# Patient Record
Sex: Male | Born: 1990 | Race: White | Hispanic: No | Marital: Single | Smoking: Never smoker
Health system: Southern US, Community
[De-identification: ages and names within clinical notes are randomized; demographics above are authoritative.]

## PROBLEM LIST (undated history)

## (undated) DIAGNOSIS — F329 Major depressive disorder, single episode, unspecified: Secondary | ICD-10-CM

## (undated) DIAGNOSIS — S2249XA Multiple fractures of ribs, unspecified side, initial encounter for closed fracture: Secondary | ICD-10-CM

## (undated) DIAGNOSIS — R569 Unspecified convulsions: Secondary | ICD-10-CM

## (undated) DIAGNOSIS — K219 Gastro-esophageal reflux disease without esophagitis: Secondary | ICD-10-CM

## (undated) DIAGNOSIS — F32A Depression, unspecified: Secondary | ICD-10-CM

## (undated) DIAGNOSIS — F419 Anxiety disorder, unspecified: Secondary | ICD-10-CM

## (undated) DIAGNOSIS — S2239XA Fracture of one rib, unspecified side, initial encounter for closed fracture: Secondary | ICD-10-CM

## (undated) HISTORY — DX: Multiple fractures of ribs, unspecified side, initial encounter for closed fracture: S22.49XA

## (undated) HISTORY — DX: Fracture of one rib, unspecified side, initial encounter for closed fracture: S22.39XA

---
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
## 2012-10-16 ENCOUNTER — Ambulatory Visit: Payer: Self-pay | Admitting: Medical

## 2013-06-28 HISTORY — PX: OTHER SURGICAL HISTORY: SHX169

## 2014-04-27 ENCOUNTER — Emergency Department: Payer: Self-pay | Admitting: Emergency Medicine

## 2014-04-27 LAB — COMPREHENSIVE METABOLIC PANEL
ALBUMIN: 4.7 g/dL (ref 3.4–5.0)
ALT: 33 U/L
ANION GAP: 7 (ref 7–16)
AST: 27 U/L (ref 15–37)
Alkaline Phosphatase: 93 U/L
BILIRUBIN TOTAL: 1 mg/dL (ref 0.2–1.0)
BUN: 20 mg/dL — ABNORMAL HIGH (ref 7–18)
CREATININE: 1.31 mg/dL — AB (ref 0.60–1.30)
Calcium, Total: 9.1 mg/dL (ref 8.5–10.1)
Chloride: 103 mmol/L (ref 98–107)
Co2: 31 mmol/L (ref 21–32)
EGFR (Non-African Amer.): >60
Glucose: 80 mg/dL (ref 65–99)
Osmolality: 283 (ref 275–301)
Potassium: 3.9 mmol/L (ref 3.5–5.1)
SODIUM: 141 mmol/L (ref 136–145)
Total Protein: 8.2 g/dL (ref 6.4–8.2)

## 2014-04-27 LAB — DRUG SCREEN, URINE
AMPHETAMINES, UR SCREEN: NEGATIVE (ref ?–1000)
BARBITURATES, UR SCREEN: NEGATIVE (ref ?–200)
Benzodiazepine, Ur Scrn: NEGATIVE (ref ?–200)
CANNABINOID 50 NG, UR ~~LOC~~: POSITIVE (ref ?–50)
Cocaine Metabolite,Ur ~~LOC~~: NEGATIVE (ref ?–300)
MDMA (Ecstasy)Ur Screen: NEGATIVE (ref ?–500)
METHADONE, UR SCREEN: NEGATIVE (ref ?–300)
Opiate, Ur Screen: NEGATIVE (ref ?–300)
PHENCYCLIDINE (PCP) UR S: NEGATIVE (ref ?–25)
Tricyclic, Ur Screen: NEGATIVE (ref ?–1000)

## 2014-04-27 LAB — CBC
HCT: 50.2 % (ref 40.0–52.0)
HGB: 16.1 g/dL (ref 13.0–18.0)
MCH: 29.7 pg (ref 26.0–34.0)
MCHC: 32 g/dL (ref 32.0–36.0)
MCV: 93 fL (ref 80–100)
Platelet: 285 10*3/uL (ref 150–440)
RBC: 5.42 10*6/uL (ref 4.40–5.90)
RDW: 12.5 % (ref 11.5–14.5)
WBC: 14.7 10*3/uL — ABNORMAL HIGH (ref 3.8–10.6)

## 2014-04-27 LAB — ACETAMINOPHEN LEVEL: Acetaminophen: <2 ug/mL

## 2014-04-27 LAB — TSH: Thyroid Stimulating Horm: 1.9 u[IU]/mL

## 2014-04-27 LAB — ETHANOL

## 2014-04-27 LAB — SALICYLATE LEVEL

## 2014-04-28 LAB — TROPONIN I

## 2014-06-16 IMAGING — US US PELVIS LIMITED
1 series · 13 of 25 positions shown · non-contrast
Comparison: none

REASON FOR EXAM: CALL REPORT [id number] Lt Testicular Mass
COMMENTS:

PROCEDURE:     US  - US TESTICULAR  - October 16, 2012 [DATE]
RESULT:     Testicular ultrasound dated 10/16/2012.
TECHNIQUE: Grayscale, color flow duplex Doppler, and spectral waveform
imaging was performed of the scrotum. Representative static imaging video
clip active series were provided for interpretation.

[Series 1: us pelvis limited · 0.08mm/px · 28 acquisitions, 13 frames shown]
[im 1/28]
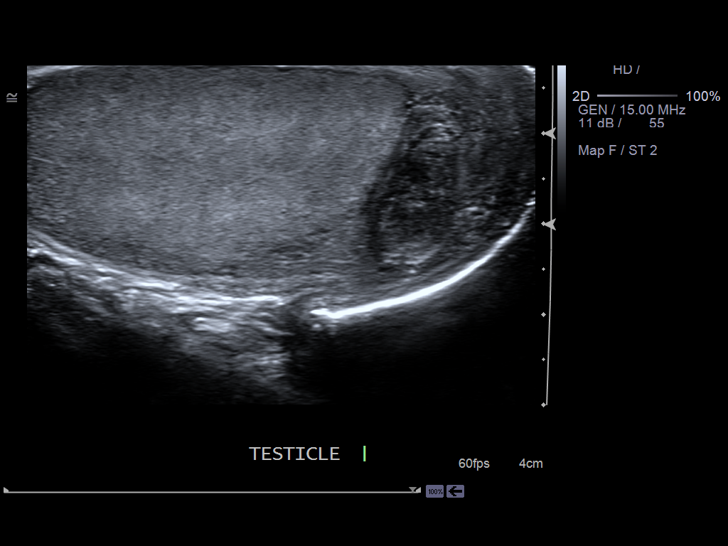
[im 3/28]
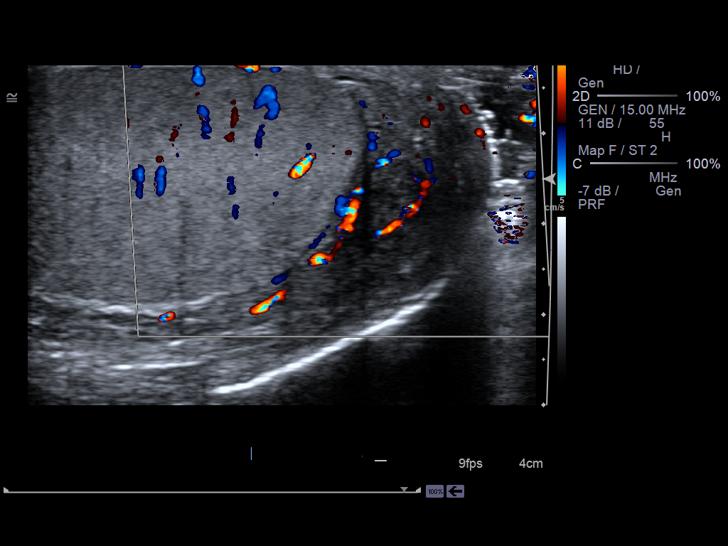
[im 5/28]
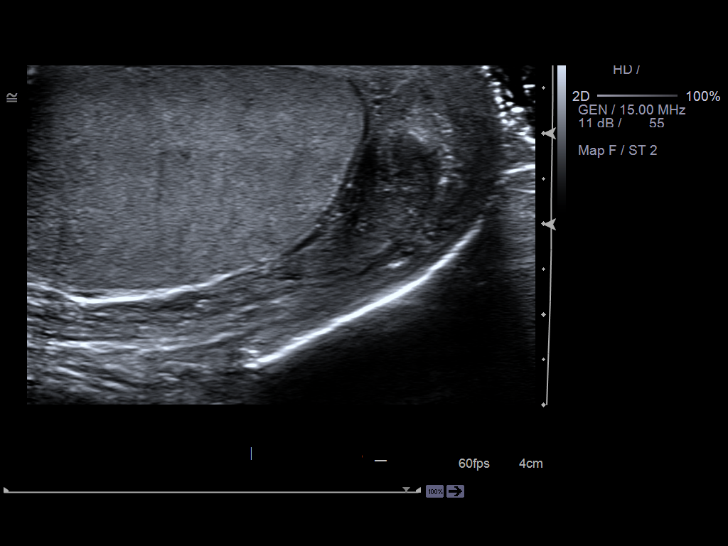
[im 7/28]
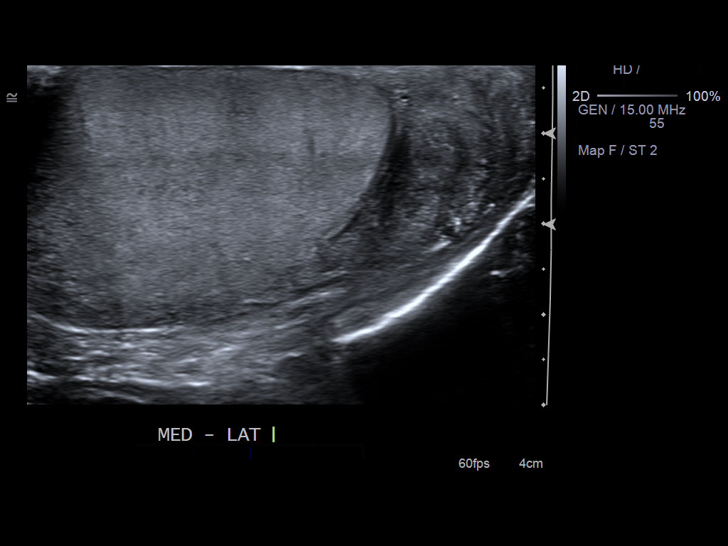
[im 10/28]
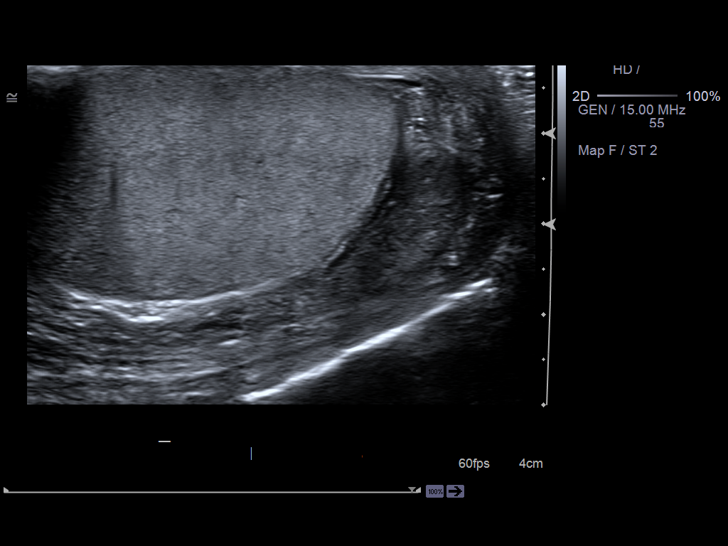
[im 12/28]
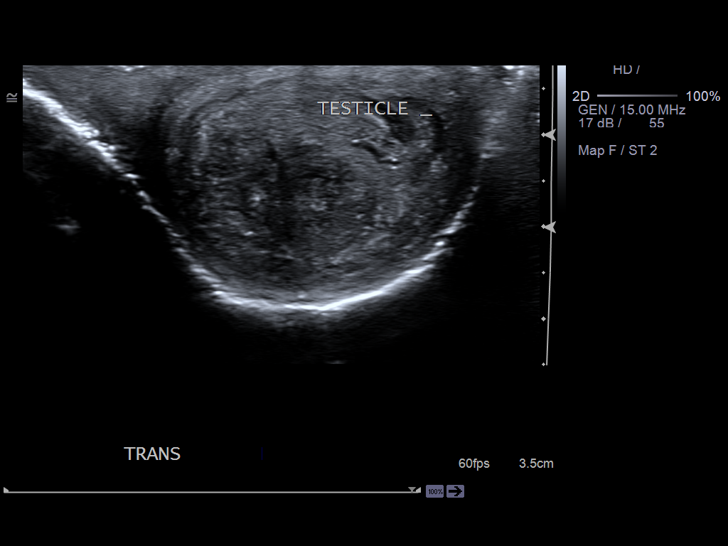
[im 14/28]
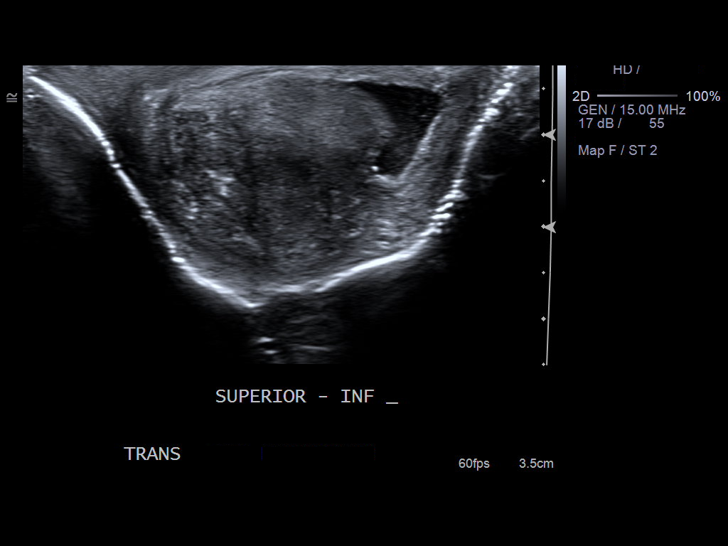
[im 16/28]
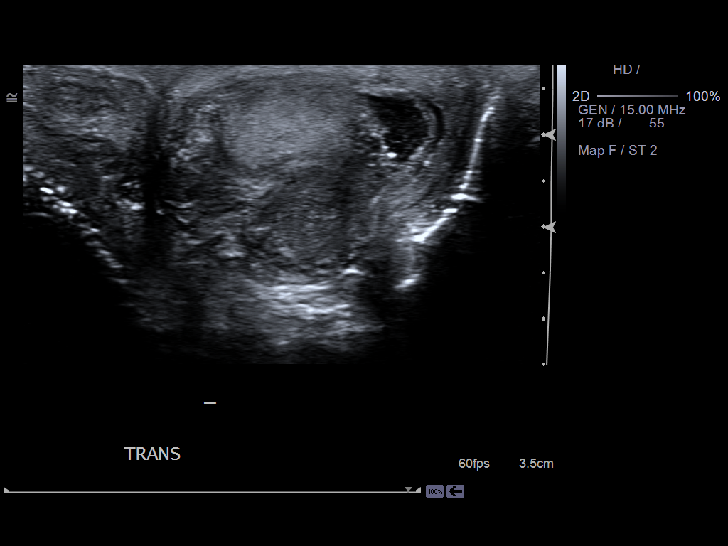
[im 19/28]
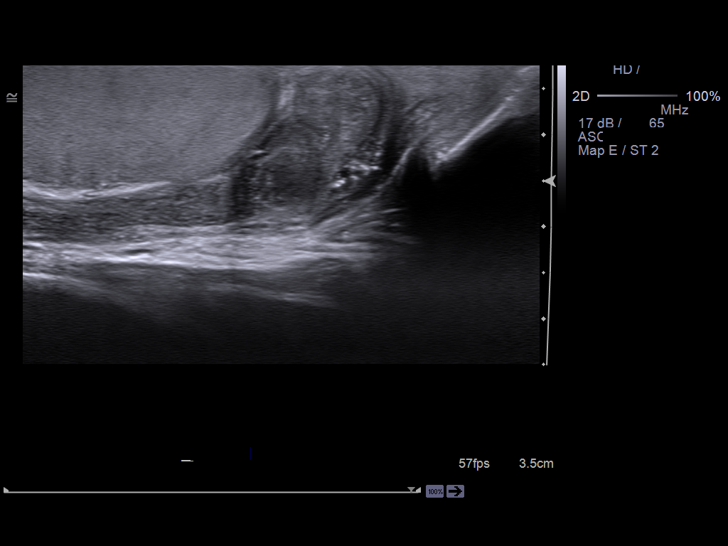
[im 21/28]
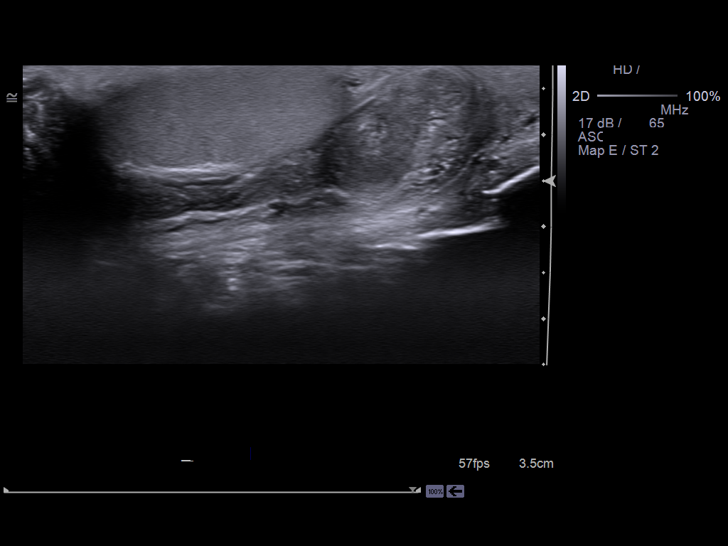
[im 23/28]
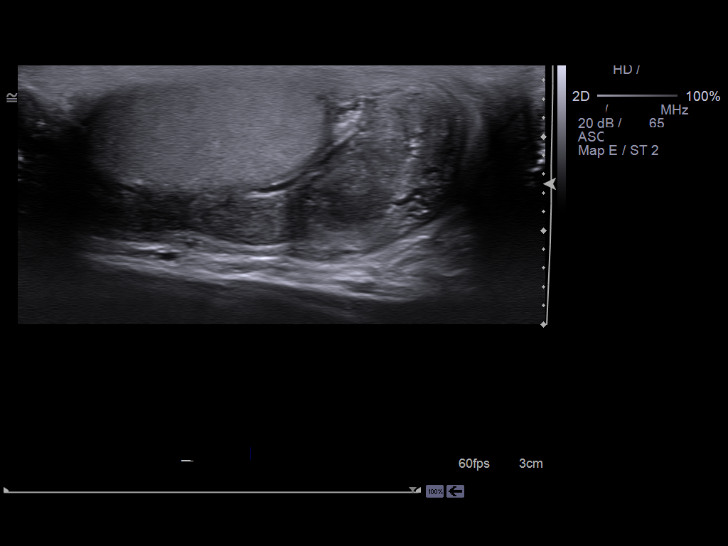
[im 25/28]
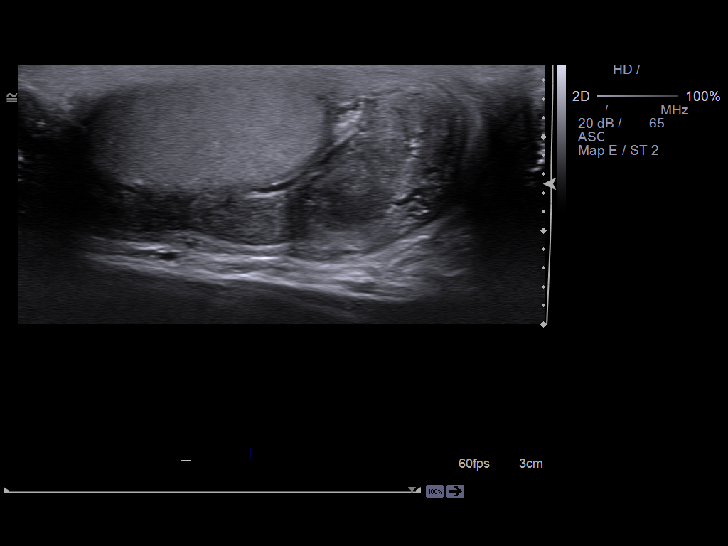
[im 28/28]
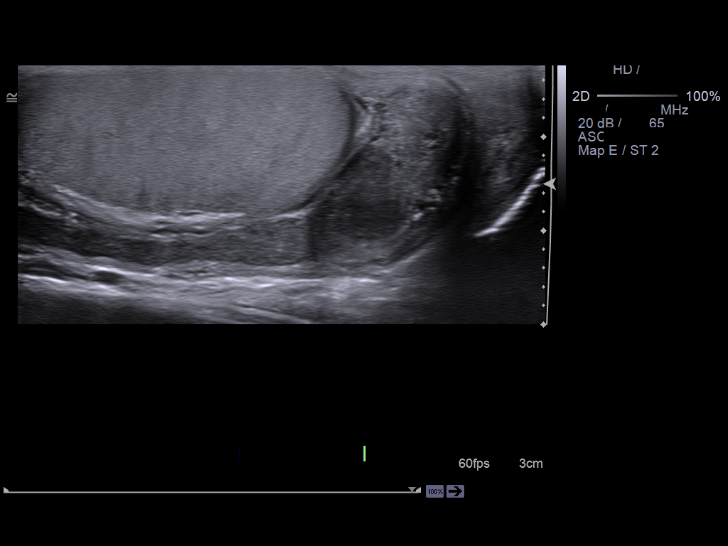

[13 of 25 positions shown; findings below may reference images not displayed]

FINDINGS: The right testicle measures 4.57 x 2.3 x 2.91 cm and the left
x 2.35 x 2.94 cm. The testes demonstrate homogeneous echotexture and
symmetric vascularity. There is no evidence of a hydrocele nor varicocele on
the right and left.

The right epididymis measures 0.72 x 1.2 x 0.74 cm and contains a cyst
measuring 0.72 x 1.2 x 0.74 cm. Appropriate vascularity is appreciated
within the epididymis which is otherwise unremarkable.

Dimensions of left epididymis are difficult to distinguish. A large
heterogeneous hypervascular masslike area projects along the mid to inferior
portion of epididymis measuring 3.94 x  2.49 x 1.56 cm. This finding is
concerning for an area of epididymitis if clinically appropriate. More
ominous etiologies such as mass cannot be excluded particularly there are no
clinical signs and symptoms or history of infection. Urologic consultation
recommended if and as clinically warranted.
IMPRESSION: 1. Findings if clinically appropriate consistent with an area of
epididymitis within the left epididymis. As stated above if there are no
clinical signs and symptoms of infection more ominous etiologies cannot be
excluded and urologic consultation recommended as clinically warranted.

## 2014-06-28 DIAGNOSIS — R569 Unspecified convulsions: Secondary | ICD-10-CM

## 2014-06-28 HISTORY — DX: Unspecified convulsions: R56.9

## 2014-08-28 ENCOUNTER — Emergency Department: Payer: Self-pay | Admitting: Emergency Medicine

## 2015-04-08 ENCOUNTER — Ambulatory Visit (INDEPENDENT_AMBULATORY_CARE_PROVIDER_SITE_OTHER): Payer: BLUE CROSS/BLUE SHIELD | Admitting: Sports Medicine

## 2015-04-08 ENCOUNTER — Encounter: Payer: Self-pay | Admitting: Sports Medicine

## 2015-04-08 VITALS — BP 110/77 | Ht 68.0 in | Wt 165.0 lb

## 2015-04-08 DIAGNOSIS — S060X0A Concussion without loss of consciousness, initial encounter: Secondary | ICD-10-CM

## 2015-04-08 NOTE — Progress Notes (Signed)
   Subjective:    Patient ID: Jerry Chandler, male    DOB: Nov 18, 1990, 24 y.o.   MRN: [id number]  HPI  24 y/o male, rugby player and senior at [employer], who presents following a concussion 10 days ago.  He has had 5-6 concussions in the past, with his last one being 3 years ago.  He was trying to tackle someone playing rubgy and hit his head on their knees.  He had a mild headache for 1 day and nausea for 1 minute after the injury, but no other symptoms.  No LOC.  He has been asymptomatic for 9 days.  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  He has had IMPACT testing, which is similar to baseline.  He claims that he has poor balance at baseline.      Review of Systems  Constitutional: Negative for diaphoresis and fatigue.  Eyes: Negative for visual disturbance.  Neurological: Negative for dizziness, syncope, weakness, light-headedness, numbness and headaches.  Psychiatric/Behavioral: Negative for behavioral problems and agitation.       Objective:   Physical Exam  Constitutional: He is oriented to person, place, and time. He appears well-developed and well-nourished.  Eyes: EOM are normal. Pupils are equal, round, and reactive to light.  Neurological: He is alert and oriented to person, place, and time.  Skin: Skin is warm and dry.  Psychiatric: He has a normal mood and affect. His behavior is normal. Judgment and thought content normal.  Convergence and divergence without nystagmus and with smooth movement.    Bess testing- double stance without errors, tandem stance with 1 error, single leg stance with 3 errors.          Assessment & Plan:  Concussion- resolved, may resume contact sports.  Reviewed with patient to avoid concussions.  He will return to full practice tomorrow.  Will send note to ATC.

## 2015-05-05 ENCOUNTER — Emergency Department
Admission: EM | Admit: 2015-05-05 | Discharge: 2015-05-05 | Disposition: A | Discharge status: Home / Self Care | Payer: BLUE CROSS/BLUE SHIELD | Attending: Emergency Medicine | Admitting: Emergency Medicine

## 2015-05-05 ENCOUNTER — Emergency Department: Payer: BLUE CROSS/BLUE SHIELD

## 2015-05-05 ENCOUNTER — Encounter: Payer: Self-pay | Admitting: Emergency Medicine

## 2015-05-05 DIAGNOSIS — R1013 Epigastric pain: Secondary | ICD-10-CM | POA: Diagnosis present

## 2015-05-05 DIAGNOSIS — Z79899 Other long term (current) drug therapy: Secondary | ICD-10-CM | POA: Insufficient documentation

## 2015-05-05 DIAGNOSIS — R1011 Right upper quadrant pain: Secondary | ICD-10-CM

## 2015-05-05 DIAGNOSIS — K8020 Calculus of gallbladder without cholecystitis without obstruction: Secondary | ICD-10-CM | POA: Insufficient documentation

## 2015-05-05 LAB — CBC
HCT: 49.2 % (ref 40.0–52.0)
Hemoglobin: 16.8 g/dL (ref 13.0–18.0)
MCH: 31.7 pg (ref 26.0–34.0)
MCHC: 34.2 g/dL (ref 32.0–36.0)
MCV: 92.8 fL (ref 80.0–100.0)
PLATELETS: 220 10*3/uL (ref 150–440)
RBC: 5.31 MIL/uL (ref 4.40–5.90)
RDW: 12.3 % (ref 11.5–14.5)
WBC: 8 10*3/uL (ref 3.8–10.6)

## 2015-05-05 LAB — BASIC METABOLIC PANEL
Anion gap: 7 (ref 5–15)
BUN: 11 mg/dL (ref 6–20)
CALCIUM: 9.6 mg/dL (ref 8.9–10.3)
CO2: 31 mmol/L (ref 22–32)
CREATININE: 0.98 mg/dL (ref 0.61–1.24)
Chloride: 101 mmol/L (ref 101–111)
GFR calc non Af Amer: >60 mL/min (ref >60)
Glucose, Bld: 81 mg/dL (ref 65–99)
Potassium: 3.5 mmol/L (ref 3.5–5.1)
SODIUM: 139 mmol/L (ref 135–145)

## 2015-05-05 LAB — TROPONIN I

## 2015-05-05 MED ORDER — OXYCODONE-ACETAMINOPHEN 5-325 MG PO TABS: 1.0000 | ORAL_TABLET | ORAL | Status: DC | PRN

## 2015-05-05 MED ORDER — ONDANSETRON 4 MG PO TBDP: 4.0000 mg | ORAL_TABLET | Freq: Three times a day (TID) | ORAL | Status: AC | PRN

## 2015-05-05 NOTE — Discharge Instructions (Signed)
Please call the general surgery office tomorrow to make an appointment for follow-up. Please eat mild and nongreasy foods to prevent pain.  You may take Tylenol or Motrin for mild to moderate pain, and Percocet for severe pain. Do not drive within 8 hours of taking Percocet. You may take Zofran for nausea.  Please return to the emergency department if you develop worsening pain, fever, inability to keep down fluids, or any other symptoms concerning to you.  Biliary Colic Biliary colic is a pain in the upper abdomen. The pain:  Is usually felt on the right side of the abdomen, but it may also be felt in the center of the abdomen, just below the breastbone (sternum).  May spread back toward the right shoulder blade.  May be steady or irregular.  May be accompanied by nausea and vomiting. Most of the time, the pain goes away in 1-5 hours. After the most intense pain passes, the abdomen may continue to ache mildly for about 24 hours. Biliary colic is caused by a blockage in the bile duct. The bile duct is a pathway that carries bile--a liquid that helps to digest fats--from the gallbladder to the small intestine. Biliary colic usually occurs after eating, when the digestive system demands bile. The pain develops when muscle cells contract forcefully to try to move the blockage so that bile can get by. HOME CARE INSTRUCTIONS  Take medicines only as directed by your health care provider.  Drink enough fluid to keep your urine clear or pale yellow.  Avoid fatty, greasy, and fried foods. These kinds of foods increase your body's demand for bile.  Avoid any foods that make your pain worse.  Avoid overeating.  Avoid having a large meal after fasting. SEEK MEDICAL CARE IF:  You develop a fever.  Your pain gets worse.  You vomit.  You develop nausea that prevents you from eating and drinking. SEEK IMMEDIATE MEDICAL CARE IF:  You suddenly develop a fever and shaking chills.  You  develop a yellowish discoloration (jaundice) of:  Skin.  Whites of the eyes.  Mucous membranes.  You have continuous or severe pain that is not relieved with medicines.  You have nausea and vomiting that is not relieved with medicines.  You develop dizziness or you faint.   This information is not intended to replace advice given to you by your health care provider. Make sure you discuss any questions you have with your health care provider.   Document Released: 11/15/2005 Document Revised: 10/29/2014 Document Reviewed: 03/26/2014 Elsevier Interactive Patient Education [employer]2016 Elsevier Inc.

## 2015-05-05 NOTE — ED Notes (Signed)
Pt reports chest pain x3 days, reports nausea with episodes. Pt denies any radiation. Describes the pain as sharp stabbing consistent pain.

## 2015-05-05 NOTE — ED Notes (Signed)
Pt in NAD.  Discharge instructions provided.  Pt voiced understanding.  Prescriptions with patient.  Teaching done regarding prescriptions and follow up visit with MD.  Voiced understanding.

## 2015-05-05 NOTE — ED Notes (Addendum)
Pt states that pain started a week ago.  States that it doesn't start after moving, but does seems to start more at midday and gets progressively worse throughout the day.  Denies nausea/vomiting/shortness of breath/diaphoresis.  Pt in NAD at this time.  States that pain was intermittent when it first started a week ago, but seems to be getting progressively worse.  States that tylenol, ibuprofen, tums, and pepto and none of which helped the pain.  States that he has a prescription for ativan for anxiety and that it seems to ease off the pain some.  States that the pain is not related to any anxiety that he has ever had in the past.

## 2015-05-05 NOTE — ED Provider Notes (Signed)
[facility] Emergency Department Provider Note  ____________________________________________  Time seen: Approximately 9:21 PM  I have reviewed the triage vital signs and the nursing notes.   HISTORY  Chief Complaint Chest Pain    HPI Jerry Chandler is a 24 y.o. male with a history of anxiety presenting w/ 1wk of epigastric abd pain.  Pt reports a burning sensation in the epigastric area.  No assoc n/v/d, fever, chills, urinary sx's, chest pain,palpitations or sob.  No improvement with Advil, TUMS or Pepto Bismol.  Stool is more green than usual. No relation to food. Pain is better in the morning and worse throughout the day but not worse when he lays down. No acidic taste in his mouth or burping.   History reviewed. No pertinent past medical history.  There are no active problems to display for this patient.   History reviewed. No pertinent past surgical history.  Current Outpatient Rx  Name  Route  Sig  Dispense  Refill  . ondansetron (ZOFRAN ODT) 4 MG disintegrating tablet   Oral   Take 1 tablet (4 mg total) by mouth every 8 (eight) hours as needed for nausea or vomiting.   20 tablet   0   . ondansetron (ZOFRAN) 4 MG tablet      TAKE 1 TABLET BY MOUTH 4 TIMES A DAY AS NEEDED FOR NAUSEA & VOMITING.      0   . oxyCODONE-acetaminophen (ROXICET) 5-325 MG tablet   Oral   Take 1 tablet by mouth every 4 (four) hours as needed.   6 tablet   0   . venlafaxine XR (EFFEXOR-XR) 75 MG 24 hr capsule   Oral   Take 75 mg by mouth daily.      6     Allergies Morphine and related  No family history on file.  Social History Social History  Substance Use Topics  . Smoking status: Never Smoker   . Smokeless tobacco: None  . Alcohol Use: No    Review of Systems Constitutional: No fever/chills. No lightheadedness or syncope. Eyes: No visual changes. ENT: No sore throat. Cardiovascular: Denies chest pain, palpitations. Respiratory: Denies  shortness of breath.  No cough. Gastrointestinal: Positive abdominal pain.  No nausea, no vomiting.  No diarrhea.  No constipation. Genitourinary: Negative for dysuria. No penile or testicular pain. Musculoskeletal: Negative for back pain. Skin: Negative for rash. Neurological: Negative for headaches, focal weakness or numbness.  10-point ROS otherwise negative.  ____________________________________________   PHYSICAL EXAM:  VITAL SIGNS: ED Triage Vitals  Enc Vitals Group     BP 05/05/15 1830 145/76 mmHg     Pulse Rate 05/05/15 1830 59     Resp 05/05/15 1830 16     Temp 05/05/15 1830 98.1 F (36.7 C)     Temp Source 05/05/15 1830 Oral     SpO2 05/05/15 1830 100 %     Weight 05/05/15 1828 165 lb (74.844 kg)     Height 05/05/15 1828  (1.727 m)     Head Cir --      Peak Flow --      Pain Score 05/05/15 1828 5     Pain Loc --      Pain Edu? --      Excl. in GC? --     Constitutional: Alert and oriented. Well appearing and in no acute distress. Answer question appropriately. Eyes: Conjunctivae are normal.  EOMI. Head: Atraumatic. Nose: No congestion/rhinnorhea. Mouth/Throat: Mucous membranes are moist.  Neck: No stridor.  Supple.   Cardiovascular: Normal rate, regular rhythm. No murmurs, rubs or gallops.  Respiratory: Normal respiratory effort.  No retractions. Lungs CTAB.  No wheezes, rales or ronchi. Gastrointestinal: Soft and nondistended. Tender to palpation in the right upper quadrant greater than epigastric area. No rebound or guarding, no peritoneal signs. Negative Murphy's sign Musculoskeletal: No LE edema.  Neurologic:  Normal speech and language. No gross focal neurologic deficits are appreciated.  Skin:  Skin is warm, dry and intact. No rash noted. Psychiatric: Mood and affect are normal. Speech and behavior are normal.  Normal judgement.  ____________________________________________   LABS (all labs ordered are listed, but only abnormal results are  displayed)  Labs Reviewed  BASIC METABOLIC PANEL  CBC  TROPONIN I   ____________________________________________  EKG  ED ECG REPORT I, Rockne Menghini, the attending physician, personally viewed and interpreted this ECG.   Date: 05/05/2015  EKG Time: 1831  Rate: 67  Rhythm: normal sinus rhythm  Axis: Normal  Intervals:none  ST&T Change: No ST changes. No ischemic changes.  ____________________________________________  RADIOLOGY  Dg Chest 2 View  05/05/2015  CLINICAL DATA:  Right-sided chest pain off and on for 1 week. EXAM: CHEST  2 VIEW COMPARISON:  None. FINDINGS: The heart size and mediastinal contours are within normal limits. Both lungs are clear. The visualized skeletal structures are unremarkable. IMPRESSION: Normal chest x-ray. Electronically Signed   By: Bary Richard M.D.   On: 05/05/2015 19:09   US Abdomen Limited Ruq  05/05/2015  CLINICAL DATA:  Right upper quadrant abdominal pain for 1 week, worsening today. EXAM: US ABDOMEN LIMITED - RIGHT UPPER QUADRANT COMPARISON:  None. FINDINGS: Gallbladder: Numerous small layering gallstones and echogenic sludge. The largest calculus measures 6 mm. Borderline gallbladder wall thickening and slight irregularity but no pericholecystic fluid. Negative sonographic Murphy sign. Common bile duct: Diameter: 4.4 mm Liver: Normal echogenicity without focal lesion or biliary dilatation. IMPRESSION: Cholelithiasis with mild/early gallbladder wall thickening. Normal caliber common bile duct and normal liver. Electronically Signed   By: Rudie Meyer M.D.   On: 05/05/2015 22:29    ____________________________________________   PROCEDURES  Procedure(s) performed: None  Critical Care performed: No ____________________________________________   INITIAL IMPRESSION / ASSESSMENT AND PLAN / ED COURSE  Pertinent labs & imaging results that were available during my care of the patient were reviewed by me and considered in my medical  decision making (see chart for details).  24 y.o. male, otherwise healthy, presenting with 1 week of intermittent epigastric pain but on exam has right upper quadrant greater than epigastric pain. It is unlikely that he has cholecystitis given the lack of nausea vomiting and fever but we will evaluate his gallbladder for gallbladder disease. If his ultrasound is negative, I will anticipate treating him for GERD with close PMD follow-up.  ----------------------------------------- 10:46 PM on 05/05/2015 -----------------------------------------  The patient has cholelithiasis with mild or early gallbladder wall thickening on his ultrasound. He has afebrile and has normal white blood cell count. His exam is very reassuring with a negative Murphy sign. He is able to tolerate by mouth. I have talked with Dr. Tonita Cong, who recommends close outpatient follow-up. I have discussed the findings with the patient as well as the follow-up plan and he understands. He will return if he has any red flag symptoms. ____________________________________________  FINAL CLINICAL IMPRESSION(S) / ED DIAGNOSES  Final diagnoses:  Right upper quadrant pain  Calculus of gallbladder without cholecystitis without obstruction  NEW MEDICATIONS STARTED DURING THIS VISIT:  New Prescriptions   ONDANSETRON (ZOFRAN ODT) 4 MG DISINTEGRATING TABLET    Take 1 tablet (4 mg total) by mouth every 8 (eight) hours as needed for nausea or vomiting.   OXYCODONE-ACETAMINOPHEN (ROXICET) 5-325 MG TABLET    Take 1 tablet by mouth every 4 (four) hours as needed.     Rockne MenghiniAnne-Caroline Vaanya Shambaugh, MD 05/05/15 2247

## 2015-05-08 ENCOUNTER — Observation Stay
Admission: EM | Admit: 2015-05-08 | Discharge: 2015-05-11 | Disposition: A | Discharge status: Home / Self Care | Payer: BLUE CROSS/BLUE SHIELD | Attending: General Surgery | Admitting: General Surgery

## 2015-05-08 ENCOUNTER — Encounter: Payer: Self-pay | Admitting: Surgery

## 2015-05-08 ENCOUNTER — Telehealth: Payer: Self-pay

## 2015-05-08 ENCOUNTER — Ambulatory Visit (INDEPENDENT_AMBULATORY_CARE_PROVIDER_SITE_OTHER): Payer: BLUE CROSS/BLUE SHIELD | Admitting: Surgery

## 2015-05-08 ENCOUNTER — Encounter: Payer: Self-pay | Admitting: Emergency Medicine

## 2015-05-08 ENCOUNTER — Other Ambulatory Visit
Admission: RE | Admit: 2015-05-08 | Discharge: 2015-05-08 | Disposition: A | Discharge status: Home / Self Care | Payer: BLUE CROSS/BLUE SHIELD | Source: Ambulatory Visit | Attending: Surgery | Admitting: Surgery

## 2015-05-08 VITALS — BP 126/84 | HR 80 | Temp 98.1°F | Ht 68.0 in | Wt 169.6 lb

## 2015-05-08 DIAGNOSIS — Z8349 Family history of other endocrine, nutritional and metabolic diseases: Secondary | ICD-10-CM | POA: Diagnosis not present

## 2015-05-08 DIAGNOSIS — G8929 Other chronic pain: Secondary | ICD-10-CM | POA: Diagnosis not present

## 2015-05-08 DIAGNOSIS — R7989 Other specified abnormal findings of blood chemistry: Secondary | ICD-10-CM | POA: Insufficient documentation

## 2015-05-08 DIAGNOSIS — K8010 Calculus of gallbladder with chronic cholecystitis without obstruction: Principal | ICD-10-CM | POA: Insufficient documentation

## 2015-05-08 DIAGNOSIS — R17 Unspecified jaundice: Secondary | ICD-10-CM | POA: Insufficient documentation

## 2015-05-08 DIAGNOSIS — R1010 Upper abdominal pain, unspecified: Secondary | ICD-10-CM

## 2015-05-08 DIAGNOSIS — K8051 Calculus of bile duct without cholangitis or cholecystitis with obstruction: Secondary | ICD-10-CM | POA: Insufficient documentation

## 2015-05-08 DIAGNOSIS — Z803 Family history of malignant neoplasm of breast: Secondary | ICD-10-CM | POA: Insufficient documentation

## 2015-05-08 DIAGNOSIS — K8050 Calculus of bile duct without cholangitis or cholecystitis without obstruction: Secondary | ICD-10-CM | POA: Diagnosis present

## 2015-05-08 DIAGNOSIS — Z9889 Other specified postprocedural states: Secondary | ICD-10-CM | POA: Insufficient documentation

## 2015-05-08 DIAGNOSIS — R1011 Right upper quadrant pain: Principal | ICD-10-CM

## 2015-05-08 LAB — CBC
HCT: 48.2 % (ref 40.0–52.0)
Hemoglobin: 15.9 g/dL (ref 13.0–18.0)
MCH: 30.7 pg (ref 26.0–34.0)
MCHC: 33 g/dL (ref 32.0–36.0)
MCV: 92.9 fL (ref 80.0–100.0)
PLATELETS: 202 10*3/uL (ref 150–440)
RBC: 5.19 MIL/uL (ref 4.40–5.90)
RDW: 12.4 % (ref 11.5–14.5)
WBC: 7.1 10*3/uL (ref 3.8–10.6)

## 2015-05-08 LAB — COMPREHENSIVE METABOLIC PANEL
ALK PHOS: 230 U/L — AB (ref 38–126)
ALT: 850 U/L — AB (ref 17–63)
ALT: 784 U/L — AB (ref 17–63)
AST: 264 U/L — AB (ref 15–41)
AST: 238 U/L — AB (ref 15–41)
Albumin: 4.6 g/dL (ref 3.5–5.0)
Albumin: 4.1 g/dL (ref 3.5–5.0)
Alkaline Phosphatase: 212 U/L — ABNORMAL HIGH (ref 38–126)
Anion gap: 8 (ref 5–15)
Anion gap: 6 (ref 5–15)
BILIRUBIN TOTAL: 4.7 mg/dL — AB (ref 0.3–1.2)
BUN: 11 mg/dL (ref 6–20)
BUN: 12 mg/dL (ref 6–20)
CALCIUM: 9.6 mg/dL (ref 8.9–10.3)
CALCIUM: 9.6 mg/dL (ref 8.9–10.3)
CHLORIDE: 101 mmol/L (ref 101–111)
CO2: 29 mmol/L (ref 22–32)
CO2: 30 mmol/L (ref 22–32)
CREATININE: 1.08 mg/dL (ref 0.61–1.24)
CREATININE: 1.1 mg/dL (ref 0.61–1.24)
Chloride: 102 mmol/L (ref 101–111)
Glucose, Bld: 94 mg/dL (ref 65–99)
Glucose, Bld: 116 mg/dL — ABNORMAL HIGH (ref 65–99)
Potassium: 4.1 mmol/L (ref 3.5–5.1)
Potassium: 3.6 mmol/L (ref 3.5–5.1)
SODIUM: 138 mmol/L (ref 135–145)
Sodium: 138 mmol/L (ref 135–145)
TOTAL PROTEIN: 7.7 g/dL (ref 6.5–8.1)
Total Bilirubin: 5.6 mg/dL — ABNORMAL HIGH (ref 0.3–1.2)
Total Protein: 7.9 g/dL (ref 6.5–8.1)

## 2015-05-08 LAB — CBC WITH DIFFERENTIAL/PLATELET
BASOS ABS: 0 10*3/uL (ref 0–0.1)
Basophils Relative: 1 %
EOS PCT: 2 %
Eosinophils Absolute: 0.1 10*3/uL (ref 0–0.7)
HCT: 50.7 % (ref 40.0–52.0)
HEMOGLOBIN: 16.7 g/dL (ref 13.0–18.0)
LYMPHS ABS: 1.1 10*3/uL (ref 1.0–3.6)
LYMPHS PCT: 18 %
MCH: 30.9 pg (ref 26.0–34.0)
MCHC: 32.9 g/dL (ref 32.0–36.0)
MCV: 93.9 fL (ref 80.0–100.0)
Monocytes Absolute: 0.3 10*3/uL (ref 0.2–1.0)
Monocytes Relative: 6 %
NEUTROS PCT: 73 %
Neutro Abs: 4.4 10*3/uL (ref 1.4–6.5)
PLATELETS: 221 10*3/uL (ref 150–440)
RBC: 5.4 MIL/uL (ref 4.40–5.90)
RDW: 12.7 % (ref 11.5–14.5)
WBC: 5.9 10*3/uL (ref 3.8–10.6)

## 2015-05-08 LAB — URINALYSIS COMPLETE WITH MICROSCOPIC (ARMC ONLY)
BACTERIA UA: NONE SEEN
GLUCOSE, UA: NEGATIVE mg/dL
Hgb urine dipstick: NEGATIVE
Ketones, ur: NEGATIVE mg/dL
LEUKOCYTES UA: NEGATIVE
NITRITE: NEGATIVE
PH: 6 (ref 5.0–8.0)
Protein, ur: NEGATIVE mg/dL
RBC / HPF: NONE SEEN RBC/hpf (ref 0–5)
SQUAMOUS EPITHELIAL / LPF: NONE SEEN
Specific Gravity, Urine: 1.011 (ref 1.005–1.030)

## 2015-05-08 LAB — LIPASE, BLOOD
Lipase: 27 U/L (ref 11–51)
Lipase: 27 U/L (ref 11–51)

## 2015-05-08 MED ORDER — LACTATED RINGERS IV SOLN
INTRAVENOUS | Status: DC
  Administered 2015-05-08 – 2015-05-11 (×8): via INTRAVENOUS

## 2015-05-08 MED ORDER — ONDANSETRON 4 MG PO TBDP: 4.0000 mg | ORAL_TABLET | Freq: Four times a day (QID) | ORAL | Status: DC | PRN

## 2015-05-08 MED ORDER — DIPHENHYDRAMINE HCL 50 MG/ML IJ SOLN: 25.0000 mg | Freq: Four times a day (QID) | INTRAMUSCULAR | Status: DC | PRN

## 2015-05-08 MED ORDER — HYDROMORPHONE HCL 1 MG/ML IJ SOLN
1.0000 mg | INTRAMUSCULAR | Status: DC | PRN
Start: 2015-05-08 — End: 2015-05-11

## 2015-05-08 MED ORDER — SODIUM CHLORIDE 0.9 % IV SOLN
Freq: Once | INTRAVENOUS | Status: AC
  Administered 2015-05-08: 1000 mL via INTRAVENOUS

## 2015-05-08 MED ORDER — ONDANSETRON HCL 4 MG/2ML IJ SOLN
4.0000 mg | Freq: Four times a day (QID) | INTRAMUSCULAR | Status: DC | PRN
  Administered 2015-05-09 (×2): 4 mg via INTRAVENOUS
  Filled 2015-05-08: qty 2

## 2015-05-08 MED ORDER — DIPHENHYDRAMINE HCL 25 MG PO CAPS
25.0000 mg | ORAL_CAPSULE | Freq: Four times a day (QID) | ORAL | Status: DC | PRN
Start: 2015-05-08 — End: 2015-05-11

## 2015-05-08 NOTE — ED Notes (Signed)
Surgeon at bedside.  

## 2015-05-08 NOTE — ED Notes (Signed)
Px with gallstones X 2 days ago in ER, pt back to ER with complaint or RUQ pain, nausea and vomiting in AM. Pt eyes appear mildly jaundiced. Pt alert and oriented X4, active, cooperative, pt in NAD. RR even and unlabored, color WNL.

## 2015-05-08 NOTE — Telephone Encounter (Signed)
Received labs back on patient at this time. Liver enzymes are extremely elevated. Notified Dr. Excell Seltzerooper of results. He suggests that patient come to emergency room at this time to get admitted with possible ERCP or surgery to follow.  Informed Dr. Juliann PulseLundquist (on call) that patient will be coming in to Emergency Room shortly.  Spoke with patient, informed him of lab results, and that he needs to make his way to emergency room immediately and to remain with nothing by mouth until he arrives. He verbalizes understanding of this information and will get to Emergency Department.

## 2015-05-08 NOTE — H&P (Signed)
Patient ID: Jerry Chandler, male   DOB: [birth year], 24 y.o.   MRN: [id number]  CC: ABDOMINAL PAIN  HPI Jerry Chandler is a 24 y.o. male with a known history of cholelithiasis who was sent to the emergency department from the surgery clinic secondary to new onset jaundice. Patient states that his pain has been pretty stable but constant in the right upper quadrant. He's not changed much since he was seen by the clinic earlier today. Denies any fevers, chills, nausea, vomiting, diarrhea, constipation. He has had some acholic stools denies any currently. 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  Abdominal Pain    Past Medical History  Diagnosis Date  . Rib fractures     secondary to vomiting with Norovirus    Past Surgical History  Procedure Laterality Date  . Facial fracture surgery  2009  . Left foot surgery  2012  . Gum graft  2015    x 2    Family History  Problem Relation Age of Onset  . Hyperlipidemia Father   . Cancer Maternal Aunt     Breast    Social History Social History  Substance Use Topics  . Smoking status: Never Smoker   . Smokeless tobacco: Never Used  . Alcohol Use: 21.6 oz/week    0 Standard drinks or equivalent, 36 Shots of liquor per week     Comment: Every other day    Allergies  Allergen Reactions  . Morphine Rash  . Lactase     No current facility-administered medications for this encounter.     Review of Systems A multipoint review of systems was completed. All pertinent positives negatives within history of present illness remainder negative.  Physical Exam Blood pressure 152/79, pulse 61, temperature 97.6 F (36.4 C), temperature source Oral, resp. rate 18, height  (1.727 m), weight 77.111 kg (170 lb), SpO2 100 %. CONSTITUTIONAL: No acute distress and appropriate.Marland Kitchen EYES: Pupils are equal, round, and reactive to light, Sclera are jaundiced. EARS, NOSE, MOUTH AND THROAT: The  oropharynx is clear. The oral mucosa is pink and moist. Hearing is intact to voice. LYMPH NODES:  Lymph nodes in the neck are normal. RESPIRATORY:  Lungs are clear. There is normal respiratory effort, with equal breath sounds bilaterally, and without pathologic use of accessory muscles. CARDIOVASCULAR: Heart is regular without murmurs, gallops, or rubs. GI: The abdomen is soft, minimally tender in the right upper quadrant, and nondistended. There are no palpable masses. There is no hepatosplenomegaly. There are normal bowel sounds in all quadrants. GU: Rectal deferred.   MUSCULOSKELETAL: Normal muscle strength and tone. No cyanosis or edema.   SKIN: Turgor is good and there are no pathologic skin lesions or ulcers. NEUROLOGIC: Motor and sensation is grossly normal. Cranial nerves are grossly intact. PSYCH:  Oriented to person, place and time. Affect is normal.  Data Reviewed Images and labs reviewed. Ultrasound from earlier this week shows cholelithiasis. Labs today concerning for hyperbilirubinemia otherwise normal labs. I have personally reviewed the patient's imaging, laboratory findings and medical records.    Assessment    24 year old male with choledocholithiasis.    Plan    Discussed with the patient we will trend his bilirubin and should decrease could be indicative having passed a stone. If he passes the stone will proceed with a lap scopic cholecystectomy with intraoperative clinic room. Should his bilirubin remained stable or increase he may require an ERCP prior to cholecystectomy.  Patient voiced understanding. We'll admit for observation. By mouth with IV hydration and when necessary medications.     Time spent with the patient was 30 minutes, with more than 50% of the time spent in face-to-face education, counseling and care coordination.     Ricarda FrameCharles Zachry Hopfensperger 05/08/2015, 7:52 PM

## 2015-05-08 NOTE — ED Provider Notes (Signed)
[facility] Emergency Department Provider Note  ____________________________________________  Time seen: Approximately 5:29 PM  I have reviewed the triage vital signs and the nursing notes.   HISTORY  Chief Complaint Abdominal Pain    HPI Jerry Chandler is a 24 y.o. male with no significant past medical history who presents from the surgery clinic with elevated liver enzymes and bilirubin.  He was diagnosed several days ago with gallstones and possible early cholecystitis, but his pain and nausea/vomiting were well controlled and he followed up as an outpatient today.  Upon repeat lab testing today he was found to have significantly elevated LFTs and a T bili of nearly 6.  He was sent to the emergency department for further evaluation.  The patient reports that the symptoms started about a week ago with essentially constant pain since that time in his right upper quadrant.  Food seems to make it worse.  He describes the symptoms as mild at baseline but they do get severe at times.  He does have intermittent nausea and vomiting.  He denies fever/chills, chest pain, shortness of breath, dysuria.  He does note that his urine is very dark today.He was seen in clinic by Dr. Excell Seltzer today.   Past Medical History  Diagnosis Date  . Rib fractures     secondary to vomiting with Norovirus    There are no active problems to display for this patient.   Past Surgical History  Procedure Laterality Date  . Facial fracture surgery  2009  . Left foot surgery  2012  . Gum graft  2015    x 2    Current Outpatient Rx  Name  Route  Sig  Dispense  Refill  . LORazepam (ATIVAN) 0.5 MG tablet   Oral   Take 0.5 mg by mouth every 8 (eight) hours as needed for anxiety.         . ondansetron (ZOFRAN ODT) 4 MG disintegrating tablet   Oral   Take 1 tablet (4 mg total) by mouth every 8 (eight) hours as needed for nausea or vomiting.   20 tablet   0   .  oxyCODONE-acetaminophen (PERCOCET/ROXICET) 5-325 MG tablet   Oral   Take 1 tablet by mouth every 8 (eight) hours as needed.         . venlafaxine XR (EFFEXOR-XR) 75 MG 24 hr capsule   Oral   Take 75 mg by mouth daily.      6     Allergies Morphine and Lactase  Family History  Problem Relation Age of Onset  . Hyperlipidemia Father   . Cancer Maternal Aunt     Breast    Social History Social History  Substance Use Topics  . Smoking status: Never Smoker   . Smokeless tobacco: Never Used  . Alcohol Use: 21.6 oz/week    0 Standard drinks or equivalent, 36 Shots of liquor per week     Comment: Every other day    Review of Systems Constitutional: No fever/chills Eyes: No visual changes. ENT: No sore throat. Cardiovascular: Denies chest pain. Respiratory: Denies shortness of breath. Gastrointestinal: Mild to severe right upper quadrant abdominal pain with occasional nausea and vomiting  Genitourinary: Negative for dysuria. Musculoskeletal: Negative for back pain. Skin: Negative for rash.  Jaundice starting today Neurological: Negative for headaches, focal weakness or numbness.  10-point ROS otherwise negative.  ____________________________________________   PHYSICAL EXAM:  VITAL SIGNS: ED Triage Vitals  Enc Vitals Group     BP  05/08/15 1652 136/72 mmHg     Pulse Rate 05/08/15 1652 77     Resp 05/08/15 1652 18     Temp 05/08/15 1652 98.3 F (36.8 C)     Temp Source 05/08/15 1652 Oral     SpO2 05/08/15 1652 96 %     Weight 05/08/15 1652 170 lb (77.111 kg)     Height 05/08/15 1652 5\' 8"  (1.727 m)     Head Cir --      Peak Flow --      Pain Score 05/08/15 1652 2     Pain Loc --      Pain Edu? --      Excl. in GC? --     Constitutional: Alert and oriented. Well appearing and in no acute distress. Eyes: Scleral icterus. PERRL. EOMI. Head: Atraumatic. Nose: No congestion/rhinnorhea. Mouth/Throat: Mucous membranes are moist.  Oropharynx  non-erythematous. Neck: No stridor.   Cardiovascular: Normal rate, regular rhythm. Grossly normal heart sounds.  Good peripheral circulation. Respiratory: Normal respiratory effort.  No retractions. Lungs CTAB. Gastrointestinal: Soft with severe tenderness to palpation of the right upper quadrant (positive Murphy sign).  No tenderness at McBurney's point.  No peritonitis. Musculoskeletal: No lower extremity tenderness nor edema.  No joint effusions. Neurologic:  Normal speech and language. No gross focal neurologic deficits are appreciated.  Skin:  Skin is warm, dry and intact with mild jaundice. No rash noted. Psychiatric: Mood and affect are normal. Speech and behavior are normal.  ____________________________________________   LABS (all labs ordered are listed, but only abnormal results are displayed)  Labs Reviewed  COMPREHENSIVE METABOLIC PANEL - Abnormal; Notable for the following:    Glucose, Bld 116 (*)    AST 238 (*)    ALT 784 (*)    Alkaline Phosphatase 212 (*)    Total Bilirubin 4.7 (*)    All other components within normal limits  URINALYSIS COMPLETEWITH MICROSCOPIC ([facility] ONLY) - Abnormal; Notable for the following:    Color, Urine AMBER (*)    APPearance CLEAR (*)    Bilirubin Urine 1+ (*)    All other components within normal limits  LIPASE, BLOOD  CBC   ____________________________________________  EKG  Not indicated ____________________________________________  RADIOLOGY   No results found.  ____________________________________________   PROCEDURES  Procedure(s) performed: None  Critical Care performed: No ____________________________________________   INITIAL IMPRESSION / ASSESSMENT AND PLAN / ED COURSE  Pertinent labs & imaging results that were available during my care of the patient were reviewed by me and considered in my medical decision making (see chart for details).  Given that he was just seen in clinic and had recent imaging, I  contacted Dr. Juliann PulseLundquist by phone.  His partner, Dr. Tonita CongWoodham, came to the emergency department to personally evaluate the patient and will admit him for further management of choledocholithiasis.  The patient's pain and nausea are well controlled at this time and he declined any medications.  ____________________________________________  FINAL CLINICAL IMPRESSION(S) / ED DIAGNOSES  Final diagnoses:  Calculus of bile duct with obstruction and without cholangitis or cholecystitis      NEW MEDICATIONS STARTED DURING THIS VISIT:  New Prescriptions   No medications on file     Loleta Roseory Gevorg Brum, MD 05/08/15 1803

## 2015-05-08 NOTE — Patient Instructions (Addendum)
You will need to go to the medical mall at the hospital to have your labs repeated today. I will call you as soon as I have the results of this tests.  Please call our office with any questions or concerns.

## 2015-05-08 NOTE — ED Notes (Signed)
C/o RUQ abd. Pain x 1 week, seen in ED on 11/7 and told he had gallstones and to follow up with surgery, saw Dr. Excell Seltzerooper today and had elevated liver enzymes, advised to go to ED for further evaluation, pt states he continues to have pain and some n,v at times

## 2015-05-08 NOTE — Progress Notes (Signed)
Surgical Consultation  05/08/2015  Jerry Chandler is an 24 y.o. male.   CC: Right upper quadrant pain  HPI: This a patient with a one-week history of right upper quadrant pain he's never had an episode like this before he did not associated with fatty foods. He has no family history of gallbladder disease. He's had no fevers or chills but has has had light colored stools and dark colored urine. Denies weight loss. 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 Rib fractures     secondary to vomiting with Norovirus    Past Surgical History  Procedure Laterality Date  . Facial fracture surgery  2009  . Left foot surgery  2012  . Gum graft  2015    x 2    Family History  Problem Relation Age of Onset  . Hyperlipidemia Father   . Cancer Maternal Aunt     Breast    Social History:  reports that he has never smoked. He has never used smokeless tobacco. He reports that he drinks about 21.6 oz of alcohol per week. He reports that he uses illicit drugs (Marijuana) about 4 times per week.  Allergies:  Allergies  Allergen Reactions  . Morphine Rash  . Lactase     Medications reviewed.   Review of Systems:   Review of Systems  Constitutional: Negative.   HENT: Negative.   Eyes: Negative.   Respiratory: Negative.   Cardiovascular: Negative.   Gastrointestinal: Positive for nausea, vomiting and abdominal pain. Negative for heartburn, diarrhea, constipation, blood in stool and melena.       Acholic stools  Genitourinary: Negative.        Dark urine  Musculoskeletal: Negative.   Skin: Negative.   Neurological: Negative.   Endo/Heme/Allergies: Negative.   Psychiatric/Behavioral: Negative.      Physical Exam:  BP 126/84 mmHg  Pulse 80  Temp(Src) 98.1 F (36.7 C) (Oral)  Ht 5\' 8"  (1.727 m)  Wt 169  lb 9.6 oz (76.93 kg)  BMI 25.79 kg/m2  Physical Exam  Constitutional: He is oriented to person, place, and time and well-developed, well-nourished, and in no distress. No distress.  HENT:  Head: Normocephalic and atraumatic.  Eyes: Pupils are equal, round, and reactive to light. Right eye exhibits no discharge. Left eye exhibits no discharge. No scleral icterus.  Cardiovascular: Normal rate, regular rhythm and normal heart sounds.   Pulmonary/Chest: Effort normal and breath sounds normal. No respiratory distress. He has no wheezes. He has no rales.  Abdominal: Soft. He exhibits no distension. There is no tenderness. There is no rebound and no guarding.  Minimal if any tenderness in the right upper quadrant with a negative Murphy sign and no mass  Musculoskeletal: Normal range of motion. He exhibits no edema.  Lymphadenopathy:    He has no cervical adenopathy.  Neurological: He is alert and oriented to person, place, and time.  Skin: Skin is warm and dry. No rash noted. No erythema.  Psychiatric: Mood, affect and judgment normal.      No results found for this or any previous visit (from the past 48 hour(s)). No results found.  Assessment/Plan:  Ultrasound suggests mild thickening of the gallbladder wall with gallstones. The bile duct was 4.4 mm. Patient gives a history of acholic stools  and dark urine but his liver function tests are completely normal. I would like to repeat those liver function tests at this point. He require laparoscopic cholecystectomy but I would like to evaluate that first. I will call him with the results and discuss potential surgery if indicated.  Lattie Haw, MD, FACS

## 2015-05-09 ENCOUNTER — Observation Stay: Payer: BLUE CROSS/BLUE SHIELD | Admitting: Anesthesiology

## 2015-05-09 ENCOUNTER — Encounter
Admission: EM | Disposition: A | Discharge status: Home / Self Care | Payer: Self-pay | Source: Home / Self Care | Attending: Emergency Medicine

## 2015-05-09 ENCOUNTER — Observation Stay: Payer: BLUE CROSS/BLUE SHIELD

## 2015-05-09 ENCOUNTER — Encounter: Payer: Self-pay | Admitting: *Deleted

## 2015-05-09 DIAGNOSIS — K8050 Calculus of bile duct without cholangitis or cholecystitis without obstruction: Secondary | ICD-10-CM | POA: Diagnosis not present

## 2015-05-09 HISTORY — PX: ERCP: SHX5425

## 2015-05-09 LAB — COMPREHENSIVE METABOLIC PANEL
ALBUMIN: 3.8 g/dL (ref 3.5–5.0)
ALT: 693 U/L — ABNORMAL HIGH (ref 17–63)
ANION GAP: 8 (ref 5–15)
AST: 225 U/L — ABNORMAL HIGH (ref 15–41)
Alkaline Phosphatase: 191 U/L — ABNORMAL HIGH (ref 38–126)
BILIRUBIN TOTAL: 5 mg/dL — AB (ref 0.3–1.2)
BUN: 13 mg/dL (ref 6–20)
CALCIUM: 9.1 mg/dL (ref 8.9–10.3)
CO2: 27 mmol/L (ref 22–32)
Chloride: 104 mmol/L (ref 101–111)
Creatinine, Ser: 1.04 mg/dL (ref 0.61–1.24)
GFR calc non Af Amer: >60 mL/min (ref >60)
GLUCOSE: 94 mg/dL (ref 65–99)
POTASSIUM: 3.6 mmol/L (ref 3.5–5.1)
Sodium: 139 mmol/L (ref 135–145)
TOTAL PROTEIN: 6.9 g/dL (ref 6.5–8.1)

## 2015-05-09 LAB — CBC
HEMATOCRIT: 45.6 % (ref 40.0–52.0)
HEMOGLOBIN: 15.5 g/dL (ref 13.0–18.0)
MCH: 31.7 pg (ref 26.0–34.0)
MCHC: 34 g/dL (ref 32.0–36.0)
MCV: 93.3 fL (ref 80.0–100.0)
Platelets: 195 10*3/uL (ref 150–440)
RBC: 4.89 MIL/uL (ref 4.40–5.90)
RDW: 12.6 % (ref 11.5–14.5)
WBC: 8 10*3/uL (ref 3.8–10.6)

## 2015-05-09 LAB — PHOSPHORUS: PHOSPHORUS: 3.8 mg/dL (ref 2.5–4.6)

## 2015-05-09 LAB — MAGNESIUM: MAGNESIUM: 2 mg/dL (ref 1.7–2.4)

## 2015-05-09 SURGERY — ERCP, WITH INTERVENTION IF INDICATED
Anesthesia: General | Laterality: Left

## 2015-05-09 MED ORDER — SODIUM CHLORIDE 0.9 % IV SOLN
INTRAVENOUS | Status: DC
  Administered 2015-05-09 (×2): via INTRAVENOUS

## 2015-05-09 MED ORDER — ONDANSETRON HCL 4 MG/2ML IJ SOLN: 4.0000 mg | Freq: Once | INTRAMUSCULAR | Status: DC | PRN

## 2015-05-09 MED ORDER — MIDAZOLAM HCL 2 MG/2ML IJ SOLN
INTRAMUSCULAR | Status: DC | PRN
  Administered 2015-05-09: 2 mg via INTRAVENOUS

## 2015-05-09 MED ORDER — SODIUM CHLORIDE 0.9 % IV SOLN
1.5000 g | Freq: Once | INTRAVENOUS | Status: AC
Start: 2015-05-09 — End: 2015-05-09
  Administered 2015-05-09: 1.5 g via INTRAVENOUS
  Filled 2015-05-09: qty 1.5

## 2015-05-09 MED ORDER — IBUPROFEN 400 MG PO TABS
200.0000 mg | ORAL_TABLET | Freq: Four times a day (QID) | ORAL | Status: DC | PRN
  Filled 2015-05-09: qty 2

## 2015-05-09 MED ORDER — FENTANYL CITRATE (PF) 100 MCG/2ML IJ SOLN: 25.0000 ug | INTRAMUSCULAR | Status: DC | PRN

## 2015-05-09 MED ORDER — SUGAMMADEX SODIUM 200 MG/2ML IV SOLN
INTRAVENOUS | Status: DC | PRN
  Administered 2015-05-09: 151.6 mg via INTRAVENOUS

## 2015-05-09 MED ORDER — DEXAMETHASONE SODIUM PHOSPHATE 4 MG/ML IJ SOLN
INTRAMUSCULAR | Status: DC | PRN
  Administered 2015-05-09: 10 mg via INTRAVENOUS

## 2015-05-09 MED ORDER — FENTANYL CITRATE (PF) 100 MCG/2ML IJ SOLN
INTRAMUSCULAR | Status: DC | PRN
  Administered 2015-05-09: 100 ug via INTRAVENOUS

## 2015-05-09 MED ORDER — ROCURONIUM BROMIDE 100 MG/10ML IV SOLN
INTRAVENOUS | Status: DC | PRN
  Administered 2015-05-09: 10 mg via INTRAVENOUS
  Administered 2015-05-09: 40 mg via INTRAVENOUS

## 2015-05-09 MED ORDER — PROPOFOL 10 MG/ML IV BOLUS
INTRAVENOUS | Status: DC | PRN
  Administered 2015-05-09: 150 mg via INTRAVENOUS

## 2015-05-09 MED ORDER — SUCCINYLCHOLINE CHLORIDE 20 MG/ML IJ SOLN
INTRAMUSCULAR | Status: DC | PRN
  Administered 2015-05-09: 120 mg via INTRAVENOUS

## 2015-05-09 MED ORDER — LIDOCAINE HCL (CARDIAC) 20 MG/ML IV SOLN
INTRAVENOUS | Status: DC | PRN
  Administered 2015-05-09: 100 mg via INTRAVENOUS

## 2015-05-09 MED ORDER — INDOMETHACIN 50 MG RE SUPP
100.0000 mg | Freq: Once | RECTAL | Status: AC
  Administered 2015-05-09: 100 mg via RECTAL
  Filled 2015-05-09: qty 2

## 2015-05-09 NOTE — Transfer of Care (Signed)
Immediate Anesthesia Transfer of Care Note  Patient: Jerry Chandler  Procedure(s) Performed: Procedure(s): ENDOSCOPIC RETROGRADE CHOLANGIOPANCREATOGRAPHY (ERCP) (Left)  Patient Location: PACU  Anesthesia Type:General  Level of Consciousness: sedated  Airway & Oxygen Therapy: Patient Spontanous Breathing and Patient connected to nasal cannula oxygen  Post-op Assessment: Report given to RN and Post -op Vital signs reviewed and stable  Post vital signs: Reviewed and stable  Last Vitals:  Filed Vitals:   05/09/15 1445  BP:   Pulse:   Temp: 36.2 C  Resp:     Complications: No apparent anesthesia complications

## 2015-05-09 NOTE — Anesthesia Preprocedure Evaluation (Signed)
Anesthesia Evaluation  Patient identified by MRN, date of birth, ID band Patient awake    Reviewed: Allergy & Precautions, NPO status   Airway Mallampati: II       Dental no notable dental hx.    Pulmonary neg pulmonary ROS,    Pulmonary exam normal        Cardiovascular negative cardio ROS   Rhythm:Regular Rate:Normal     Neuro/Psych    GI/Hepatic negative GI ROS, Neg liver ROS,   Endo/Other  negative endocrine ROS  Renal/GU negative Renal ROS     Musculoskeletal negative musculoskeletal ROS (+)   Abdominal Normal abdominal exam  (+)   Peds negative pediatric ROS (+)  Hematology negative hematology ROS (+)   Anesthesia Other Findings   Reproductive/Obstetrics negative OB ROS                             Anesthesia Physical Anesthesia Plan  ASA: I  Anesthesia Plan: General   Post-op Pain Management:    Induction: Intravenous  Airway Management Planned: Oral ETT  Additional Equipment:   Intra-op Plan:   Post-operative Plan: Extubation in OR  Informed Consent: I have reviewed the patients History and Physical, chart, labs and discussed the procedure including the risks, benefits and alternatives for the proposed anesthesia with the patient or authorized representative who has indicated his/her understanding and acceptance.     Plan Discussed with: CRNA  Anesthesia Plan Comments:         Anesthesia Quick Evaluation  

## 2015-05-09 NOTE — Anesthesia Procedure Notes (Signed)
Procedure Name: Intubation Date/Time: 05/09/2015 1:57 PM Performed by: Junious SilkNOLES, Adeel Guiffre Pre-anesthesia Checklist: Patient identified, Patient being monitored, Timeout performed, Emergency Drugs available and Suction available Patient Re-evaluated:Patient Re-evaluated prior to inductionOxygen Delivery Method: Circle system utilized Preoxygenation: Pre-oxygenation with 100% oxygen Intubation Type: IV induction Ventilation: Mask ventilation without difficulty Laryngoscope Size: Mac and 3 Grade View: Grade I Tube type: Oral Tube size: 7.5 mm Number of attempts: 1 Airway Equipment and Method: Stylet Placement Confirmation: ETT inserted through vocal cords under direct vision,  positive ETCO2 and breath sounds checked- equal and bilateral Secured at: 21 cm Tube secured with: Tape Dental Injury: Teeth and Oropharynx as per pre-operative assessment

## 2015-05-09 NOTE — OR Nursing (Signed)
Spinterotomy done with pt. In left prone position. Balloon sweep done . No.7x5 advatix stent inserted and left to drain .

## 2015-05-09 NOTE — Op Note (Signed)
ERCP showed narrowing of distal CBD. Unable to get balloon up the CBD. Therefore, 7 Fr x 5 cm biliary stent placed with good bile drainage. Would order CT with pancreas protocol 1st.

## 2015-05-09 NOTE — Op Note (Signed)
[facility] Gastroenterology Patient Name: Jerry Chandler Procedure Date: 05/09/2015 1:34 PM MRN: [id number] Account #: [id number] Date of Birth: [birth year] Admit Type: Inpatient Age: 24 Room: [facility] ENDO ROOM 4 Gender: Male Note Status: Finalized Procedure:         ERCP Indications:       Jaundice, gallstones Providers:         Ezzard Standing. Bluford Kaufmann, MD Referring MD:      Sallye Lat Md, MD (Referring MD) Medicines:         General Anesthesia Complications:     No immediate complications. Procedure:         Pre-Anesthesia Assessment:                    - Prior to the procedure, a History and Physical was                     performed, and patient medications, allergies and                     sensitivities were reviewed. The patient's tolerance of                     previous anesthesia was reviewed.                    - The risks and benefits of the procedure and the sedation                     options and risks were discussed with the patient. All                     questions were answered and informed consent was obtained.                    - After reviewing the risks and benefits, the patient was                     deemed in satisfactory condition to undergo the procedure.                    After obtaining informed consent, the scope was passed                     under direct vision. Throughout the procedure, the                     patient's blood pressure, pulse, and oxygen saturations                     were monitored continuously. The Enteroscope was                     introduced through the mouth, and used to inject contrast                     into and used to cannulate the bile duct. The ERCP was                     accomplished without difficulty. The patient tolerated the                     procedure well. Findings:      The scout film was normal. The esophagus was successfully intubated  under direct vision. The scope was advanced to a normal  major papilla in       the descending duodenum without detailed examination of the pharynx,       larynx and associated structures, and upper GI tract. The upper GI tract       was grossly normal. The bile duct was deeply cannulated with the       short-nosed traction sphincterotome. Contrast was injected. I personally       interpreted the bile duct images. Ductal flow of contrast was adequate.       Image quality was adequate. Contrast extended to the entire biliary       tree. The lower third of the main bile duct contained a single localized       stenosis. A straight Roadrunner wire was passed into the biliary tree.       Biliary sphincterotomy was made with a monofilament traction (standard)       sphincterotome using ERBE electrocautery. There was no       post-sphincterotomy bleeding. Unable to place balloon up the CBD. One 7       Fr by 5 cm temporary stent with two external flaps and two internal       flaps was placed into the common bile duct. Bile flowed through the       stent. The stent was in good position. 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 The stricture                     was benign appearing.                    - A sphincterotomy was performed.                    - One temporary stent was placed into the common bile duct. Recommendation:    - Observe patient's clinical course.                    - The findings and recommendations were discussed with the                     patient's family.                    - Watch for pancreatitis, bleeding, perforation, and                     cholangitis. Procedure Code(s): --- Professional ---                    [phone], Endoscopic retrograde cholangiopancreatography                     (ERCP); with placement of endoscopic stent into biliary or                     pancreatic duct, including pre- and post-dilation and                     guide wire passage, when performed, including                      sphincterotomy, when performed, each stent Diagnosis Code(s): --- Professional ---  K83.1, Obstruction of bile duct                    R17, Unspecified jaundice CPT copyright 2014 American Medical Association. All rights reserved. The codes documented in this report are preliminary and upon coder review may  be revised to meet current compliance requirements. Wallace Cullens, MD 05/09/2015 2:53:33 PM This report has been signed electronically. Number of Addenda: 0 Note Initiated On: 05/09/2015 1:34 PM      [facility]

## 2015-05-09 NOTE — Consult Note (Signed)
  Pt seen and examined. Please see C. Peggye PittRichards' notes. Pt with gallstones and persistent jaundice. Though previous U/S showed normal CBD, concern is that there are now CBD stones causing obstruction. Discussed in detail about ERCP as well as potential risks of pancreatitis. Prophylactic Abx and indocin PR given. Discussed possibility of placing pancreatic stent depending on difficulty of cannulating CBD. Pt agreed.

## 2015-05-09 NOTE — Anesthesia Postprocedure Evaluation (Signed)
  Anesthesia Post-op Note  Patient: Jerry Chandler  Procedure(s) Performed: Procedure(s): ENDOSCOPIC RETROGRADE CHOLANGIOPANCREATOGRAPHY (ERCP) (Left)  Anesthesia type:General  Patient location: PACU  Post pain: Pain level controlled  Post assessment: Post-op Vital signs reviewed, Patient's Cardiovascular Status Stable, Respiratory Function Stable, Patent Airway and No signs of Nausea or vomiting  Post vital signs: Reviewed and stable  Last Vitals:  Filed Vitals:   05/09/15 1545  BP: 132/59  Pulse: 75  Temp: 36.3 C  Resp: 22    Level of consciousness: awake, alert  and patient cooperative  Complications: No apparent anesthesia complications

## 2015-05-09 NOTE — Progress Notes (Signed)
Surgery Progress Note  S: Pain improved O:Blood pressure 152/66, pulse 61, temperature 98.2 F (36.8 C), temperature source Tympanic, resp. rate 18, height 5\' 8"  (1.727 m), weight 167 lb (75.751 kg), SpO2 100 %. GEN: NAD/A&Ox3 ABD: soft, min tender, nondistended  Labs:  WBC 8.0 Bili 5.0 AST 225/ALT 693  A/P 24 yo admit with possible choledocholithiasis - ERCP today

## 2015-05-09 NOTE — Consult Note (Signed)
GI Inpatient Consult Note  Reason for Consult:  Choledocholithiasis   Attending Requesting Consult: Dr. Adonis Huguenin  History of Present Illness: Jerry Chandler is a 24 y.o. male who reports he has had ruq pain, nausea and vomiting for the past week.  He was seen in the ED on 05/05/2015 and had an [place] that showed cholelithiasis with mild or early gallbladder wall thickening.  He was referred to outpatient follow up.  During his office visit with surgery on 05/08/2015, his liver labs were elevated, alk phos 230, AST 264, ALT 850 and total bili of 5.6.  He reports the N and V occur mainly in the morning, vomit is brownish/reddish in color, no undigested food.  He has been taking Zofran since his ED visit with relief of nausea.  His last episode of vomiting was this morning at 7am.  He reports that his urin has been the color of "dark lager" this past week,  He reports his abdominal pain at 2/10 currently.  He has no other GI complaints at this time.  Past Medical History:  Past Medical History  Diagnosis Date  . Rib fractures     secondary to vomiting with Norovirus    Problem List: Patient Active Problem List   Diagnosis Date Noted  . Choledocholithiasis 05/08/2015    Past Surgical History: Past Surgical History  Procedure Laterality Date  . Facial fracture surgery  2009  . Left foot surgery  2012  . Gum graft  2015    x 2    Allergies: Allergies  Allergen Reactions  . Morphine Rash  . Lactase     Home Medications: Prescriptions prior to admission  Medication Sig Dispense Refill Last Dose  . LORazepam (ATIVAN) 0.5 MG tablet Take 0.5 mg by mouth every 8 (eight) hours as needed for anxiety.   prn at prn  . ondansetron (ZOFRAN ODT) 4 MG disintegrating tablet Take 1 tablet (4 mg total) by mouth every 8 (eight) hours as needed for nausea or vomiting. 20 tablet 0 05/08/2015 at Unknown time  . oxyCODONE-acetaminophen (PERCOCET/ROXICET) 5-325 MG tablet Take 1 tablet by mouth every 8  (eight) hours as needed.   05/08/2015 at 1000  . venlafaxine XR (EFFEXOR-XR) 75 MG 24 hr capsule Take 75 mg by mouth daily.  6 05/08/2015 at Unknown time   Home medication reconciliation was completed with the patient.   Scheduled Inpatient Medications:   . ampicillin-sulbactam (UNASYN) 1.5 g IVPB  1.5 g Intravenous Once  . indomethacin  100 mg Rectal Once    Continuous Inpatient Infusions:   . sodium chloride    . lactated ringers 125 mL/hr at 05/08/15 2000    PRN Inpatient Medications:  diphenhydrAMINE **OR** diphenhydrAMINE, HYDROmorphone (DILAUDID) injection, ondansetron **OR** ondansetron (ZOFRAN) IV  Family History: family history includes Cancer in his maternal aunt; Hyperlipidemia in his father.    Social History:   reports that he has never smoked. He has never used smokeless tobacco. He reports that he drinks about 21.6 oz of alcohol per week. He reports that he uses illicit drugs (Marijuana) about 4 times per week.   Review of Systems: Constitutional: Weight is stable.  Eyes: No changes in vision. ENT: No oral lesions, sore throat.  GI: see HPI.  Heme/Lymph: No easy bruising.  CV: No chest pain.  GU: No hematuria.  Integumentary: No rashes.  Neuro: No headaches.  Psych: No depression/anxiety.  Endocrine: No heat/cold intolerance.  Allergic/Immunologic: No urticaria.  Resp: No cough, SOB.  Musculoskeletal: No  joint swelling.    Physical Examination: BP 135/72 mmHg  Pulse 61  Temp(Src) 97.5 F (36.4 C) (Oral)  Resp 18  Ht 5' 8"  (1.727 m)  Wt 75.932 kg (167 lb 6.4 oz)  BMI 25.46 kg/m2  SpO2 98% Gen: NAD, alert and oriented x 4 HEENT: PEERLA, EOMI, sclera icterus noted. Neck: supple, no JVD or thyromegaly Chest: CTA bilaterally, no wheezes, crackles, or other adventitious sounds CV: RRR, no m/g/c/r Abd: soft, NT, ND, +BS in all four quadrants; no HSM, guarding, ridigity, or rebound tenderness Ext: no edema, well perfused with 2+ pulses, Skin: jaundice  is noted Lymph: no LAD  Data: Lab Results  Component Value Date   WBC 8.0 05/09/2015   HGB 15.5 05/09/2015   HCT 45.6 05/09/2015   MCV 93.3 05/09/2015   PLT 195 05/09/2015    Recent Labs Lab 05/08/15 1130 05/08/15 1656 05/09/15 0415  HGB 16.7 15.9 15.5   Lab Results  Component Value Date   NA 139 05/09/2015   K 3.6 05/09/2015   CL 104 05/09/2015   CO2 27 05/09/2015   BUN 13 05/09/2015   CREATININE 1.04 05/09/2015   Lab Results  Component Value Date   ALT 693* 05/09/2015   AST 225* 05/09/2015   ALKPHOS 191* 05/09/2015   BILITOT 5.0* 05/09/2015   No results for input(s): APTT, INR, PTT in the last 168 hours.   Imaging  CLINICAL DATA: Right upper quadrant abdominal pain for 1 week, worsening today.  EXAM: US ABDOMEN LIMITED - RIGHT UPPER QUADRANT  COMPARISON: None.  FINDINGS: Gallbladder:  Numerous small layering gallstones and echogenic sludge. The largest calculus measures 6 mm. Borderline gallbladder wall thickening and slight irregularity but no pericholecystic fluid. Negative sonographic Murphy sign.  Common bile duct:  Diameter: 4.4 mm  Liver:  Normal echogenicity without focal lesion or biliary dilatation.  IMPRESSION: Cholelithiasis with mild/early gallbladder wall thickening.  Normal caliber common bile duct and normal liver.   Electronically Signed  By: Marijo Sanes M.D.  On: 05/05/2015 22:29       Assessment/Plan: Mr. Lipson is a 24 y.o. male with one week of RUQ pain, cholelithiasis on [place], dark colored urine, N and V, elevated LFT"s :  alk phos 230, AST 264, ALT 850 and total bili of 5.6.  Recommendations: Patient is scheduled for ERCP with Dr. Candace Cruise this afternoon. We will continue to follow with you. Thank you for the consult. Please call with questions or concerns.  Salvadore Farber, PA-C  I personally performed these services.

## 2015-05-10 ENCOUNTER — Observation Stay: Payer: BLUE CROSS/BLUE SHIELD

## 2015-05-10 ENCOUNTER — Encounter: Payer: Self-pay | Admitting: Radiology

## 2015-05-10 LAB — COMPREHENSIVE METABOLIC PANEL
ALT: 553 U/L — ABNORMAL HIGH (ref 17–63)
ANION GAP: 7 (ref 5–15)
AST: 150 U/L — AB (ref 15–41)
Albumin: 3.4 g/dL — ABNORMAL LOW (ref 3.5–5.0)
Alkaline Phosphatase: 153 U/L — ABNORMAL HIGH (ref 38–126)
BILIRUBIN TOTAL: 2 mg/dL — AB (ref 0.3–1.2)
BUN: 10 mg/dL (ref 6–20)
CHLORIDE: 107 mmol/L (ref 101–111)
CO2: 25 mmol/L (ref 22–32)
Calcium: 8.6 mg/dL — ABNORMAL LOW (ref 8.9–10.3)
Creatinine, Ser: 0.89 mg/dL (ref 0.61–1.24)
GFR calc Af Amer: >60 mL/min (ref >60)
GLUCOSE: 99 mg/dL (ref 65–99)
POTASSIUM: 3.9 mmol/L (ref 3.5–5.1)
Sodium: 139 mmol/L (ref 135–145)
Total Protein: 6.1 g/dL — ABNORMAL LOW (ref 6.5–8.1)

## 2015-05-10 LAB — CBC
HCT: 42.9 % (ref 40.0–52.0)
Hemoglobin: 14.1 g/dL (ref 13.0–18.0)
MCH: 30.7 pg (ref 26.0–34.0)
MCHC: 32.9 g/dL (ref 32.0–36.0)
MCV: 93.6 fL (ref 80.0–100.0)
PLATELETS: 185 10*3/uL (ref 150–440)
RBC: 4.59 MIL/uL (ref 4.40–5.90)
RDW: 12.7 % (ref 11.5–14.5)
WBC: 8.9 10*3/uL (ref 3.8–10.6)

## 2015-05-10 LAB — MRSA PCR SCREENING: MRSA BY PCR: NEGATIVE

## 2015-05-10 LAB — LIPASE, BLOOD: LIPASE: 29 U/L (ref 11–51)

## 2015-05-10 MED ORDER — IOHEXOL 300 MG/ML  SOLN
100.0000 mL | Freq: Once | INTRAMUSCULAR | Status: AC | PRN
  Administered 2015-05-10: 100 mL via INTRAVENOUS

## 2015-05-10 MED ORDER — PANTOPRAZOLE SODIUM 40 MG PO TBEC
40.0000 mg | DELAYED_RELEASE_TABLET | Freq: Every day | ORAL | Status: DC
  Administered 2015-05-10: 40 mg via ORAL
  Filled 2015-05-10: qty 1

## 2015-05-10 NOTE — Consult Note (Signed)
  CT report noted. Gallstones. Pancreas normal. Awaiting GB surgery when T. Bili down further. Pt did have gastritis in stomach when I performed ERCP yesterday. Will start pt on daily protonix.

## 2015-05-10 NOTE — Progress Notes (Signed)
Surgery Progress Note  S: Underwent ERCP with stent for CBD narrowing.  No pain O:Blood pressure 132/61, pulse 54, temperature 97.4 F (36.3 C), temperature source Oral, resp. rate 22, height 5\' 8"  (1.727 m), weight 167 lb (75.751 kg), SpO2 100 %. GEN: NAD/A&Ox3 ABD: soft, nontender, nondistended  Bili 2.0  A/P 24 yo admit with hyperbilirubinemia, ERCP showed narrowing of CBD, stent placed with good improvement in biliribinemia - CT pancreas protocol to eval for possible extrensic etiology of narrowing

## 2015-05-10 NOTE — Progress Notes (Signed)
Spoke to Dr. Juliann PulseLundquist regarding patient's request to shower.  Orders received.

## 2015-05-10 NOTE — Plan of Care (Signed)
Problem: Pain Managment: Goal: General experience of comfort will improve Outcome: Progressing Patient has had no reports of pain this shift.

## 2015-05-10 NOTE — Progress Notes (Signed)
CT noted.  Plan on lap chole when bilirubin normalized, likely tomorrow.

## 2015-05-10 NOTE — Consult Note (Signed)
  GI Inpatient Follow-up Note  Patient Identification: Jerry Chandler is a 24 y.o. male with jaundice.  Subjective:Overall feels better. Less abdominal pain. LFT improving. No evidence of pancreatitis. CT of abdomen pending.  Scheduled Inpatient Medications:     Continuous Inpatient Infusions:   . lactated ringers 125 mL/hr at 05/10/15 1027    PRN Inpatient Medications:  diphenhydrAMINE **OR** diphenhydrAMINE, HYDROmorphone (DILAUDID) injection, ibuprofen, ondansetron **OR** ondansetron (ZOFRAN) IV  Review of Systems: Constitutional: Weight is stable.  Eyes: No changes in vision. ENT: No oral lesions, sore throat.  GI: see HPI.  Heme/Lymph: No easy bruising.  CV: No chest pain.  GU: No hematuria.  Integumentary: No rashes.  Neuro: No headaches.  Psych: No depression/anxiety.  Endocrine: No heat/cold intolerance.  Allergic/Immunologic: No urticaria.  Resp: No cough, SOB.  Musculoskeletal: No joint swelling.    Physical Examination: BP 132/61 mmHg  Pulse 54  Temp(Src) 97.4 F (36.3 C) (Oral)  Resp 22  Ht 5\' 8"  (1.727 m)  Wt 75.751 kg (167 lb)  BMI 25.40 kg/m2  SpO2 100% Gen: NAD, alert and oriented x 4 HEENT: PEERLA, EOMI, Neck: supple, no JVD or thyromegaly Chest: CTA bilaterally, no wheezes, crackles, or other adventitious sounds CV: RRR, no m/g/c/r Abd: soft, NT, ND, +BS in all four quadrants; no HSM, guarding, ridigity, or rebound tenderness Ext: no edema, well perfused with 2+ pulses, Skin: no rash or lesions noted Lymph: no LAD  Data: Lab Results  Component Value Date   WBC 8.9 05/10/2015   HGB 14.1 05/10/2015   HCT 42.9 05/10/2015   MCV 93.6 05/10/2015   PLT 185 05/10/2015    Recent Labs Lab 05/08/15 1656 05/09/15 0415 05/10/15 0713  HGB 15.9 15.5 14.1   Lab Results  Component Value Date   NA 139 05/10/2015   K 3.9 05/10/2015   CL 107 05/10/2015   CO2 25 05/10/2015   BUN 10 05/10/2015   CREATININE 0.89 05/10/2015   Lab Results   Component Value Date   ALT 553* 05/10/2015   AST 150* 05/10/2015   ALKPHOS 153* 05/10/2015   BILITOT 2.0* 05/10/2015   No results for input(s): APTT, INR, PTT in the last 168 hours. Assessment/Plan: Mr. Jerry Chandler is a [age] y.o. male with improving jaundice after biliary stent placement.    Recommendations: Continue to moniter LFT. Await CT. Will need repeat ERCP in 2 month or so to remove stent. GB surgery pending. Please call with questions or concerns.  Denver Bentson, Ezzard StandingPAUL Y, MD

## 2015-05-11 ENCOUNTER — Observation Stay: Payer: BLUE CROSS/BLUE SHIELD | Admitting: Anesthesiology

## 2015-05-11 ENCOUNTER — Encounter: Payer: Self-pay | Admitting: Anesthesiology

## 2015-05-11 ENCOUNTER — Encounter
Admission: EM | Disposition: A | Discharge status: Home / Self Care | Payer: Self-pay | Source: Home / Self Care | Attending: Emergency Medicine

## 2015-05-11 DIAGNOSIS — K8051 Calculus of bile duct without cholangitis or cholecystitis with obstruction: Secondary | ICD-10-CM | POA: Diagnosis not present

## 2015-05-11 DIAGNOSIS — K8050 Calculus of bile duct without cholangitis or cholecystitis without obstruction: Secondary | ICD-10-CM | POA: Diagnosis not present

## 2015-05-11 HISTORY — PX: CHOLECYSTECTOMY: SHX55

## 2015-05-11 LAB — COMPREHENSIVE METABOLIC PANEL
ALBUMIN: 3.7 g/dL (ref 3.5–5.0)
ALT: 556 U/L — ABNORMAL HIGH (ref 17–63)
ANION GAP: 6 (ref 5–15)
AST: 155 U/L — AB (ref 15–41)
Alkaline Phosphatase: 140 U/L — ABNORMAL HIGH (ref 38–126)
BUN: 13 mg/dL (ref 6–20)
CHLORIDE: 108 mmol/L (ref 101–111)
CO2: 28 mmol/L (ref 22–32)
Calcium: 8.7 mg/dL — ABNORMAL LOW (ref 8.9–10.3)
Creatinine, Ser: 1.01 mg/dL (ref 0.61–1.24)
GFR calc Af Amer: >60 mL/min (ref >60)
GLUCOSE: 98 mg/dL (ref 65–99)
POTASSIUM: 3.3 mmol/L — AB (ref 3.5–5.1)
Sodium: 142 mmol/L (ref 135–145)
Total Bilirubin: 1.6 mg/dL — ABNORMAL HIGH (ref 0.3–1.2)
Total Protein: 6.1 g/dL — ABNORMAL LOW (ref 6.5–8.1)

## 2015-05-11 SURGERY — Surgical Case
Anesthesia: *Unknown

## 2015-05-11 SURGERY — LAPAROSCOPIC CHOLECYSTECTOMY
Anesthesia: General

## 2015-05-11 MED ORDER — LIDOCAINE HCL 1 % IJ SOLN
INTRAMUSCULAR | Status: DC | PRN
  Administered 2015-05-11: 18 mL

## 2015-05-11 MED ORDER — ONDANSETRON HCL 4 MG/2ML IJ SOLN: 4.0000 mg | Freq: Once | INTRAMUSCULAR | Status: DC | PRN

## 2015-05-11 MED ORDER — CEFAZOLIN SODIUM 1-5 GM-% IV SOLN
INTRAVENOUS | Status: DC | PRN
  Administered 2015-05-11: 1 g via INTRAVENOUS

## 2015-05-11 MED ORDER — LIDOCAINE HCL (CARDIAC) 20 MG/ML IV SOLN
INTRAVENOUS | Status: DC | PRN
  Administered 2015-05-11: 100 mg via INTRAVENOUS

## 2015-05-11 MED ORDER — NEOSTIGMINE METHYLSULFATE 10 MG/10ML IV SOLN
INTRAVENOUS | Status: DC | PRN
  Administered 2015-05-11: 4 mg via INTRAVENOUS

## 2015-05-11 MED ORDER — DEXMEDETOMIDINE HCL 200 MCG/2ML IV SOLN
INTRAVENOUS | Status: DC | PRN
  Administered 2015-05-11: 40 ug via INTRAVENOUS

## 2015-05-11 MED ORDER — BUPIVACAINE-EPINEPHRINE (PF) 0.25% -1:200000 IJ SOLN
INTRAMUSCULAR | Status: AC
  Filled 2015-05-11: qty 30

## 2015-05-11 MED ORDER — FENTANYL CITRATE (PF) 100 MCG/2ML IJ SOLN
INTRAMUSCULAR | Status: AC
  Filled 2015-05-11: qty 2

## 2015-05-11 MED ORDER — HYDROCODONE-ACETAMINOPHEN 5-325 MG PO TABS: 1.0000 | ORAL_TABLET | ORAL | Status: DC | PRN

## 2015-05-11 MED ORDER — MIDAZOLAM HCL 2 MG/2ML IJ SOLN
INTRAMUSCULAR | Status: DC | PRN
  Administered 2015-05-11: 2 mg via INTRAVENOUS
  Administered 2015-05-11: 3 mg via INTRAVENOUS

## 2015-05-11 MED ORDER — ROCURONIUM BROMIDE 100 MG/10ML IV SOLN
INTRAVENOUS | Status: DC | PRN
  Administered 2015-05-11 (×2): 10 mg via INTRAVENOUS
  Administered 2015-05-11: 5 mg via INTRAVENOUS

## 2015-05-11 MED ORDER — FENTANYL CITRATE (PF) 100 MCG/2ML IJ SOLN
INTRAMUSCULAR | Status: DC | PRN
  Administered 2015-05-11 (×4): 50 ug via INTRAVENOUS
  Administered 2015-05-11 (×2): 100 ug via INTRAVENOUS
  Administered 2015-05-11: 50 ug via INTRAVENOUS

## 2015-05-11 MED ORDER — DEXAMETHASONE SODIUM PHOSPHATE 4 MG/ML IJ SOLN
INTRAMUSCULAR | Status: DC | PRN
  Administered 2015-05-11: 10 mg via INTRAVENOUS

## 2015-05-11 MED ORDER — FENTANYL CITRATE (PF) 100 MCG/2ML IJ SOLN
25.0000 ug | INTRAMUSCULAR | Status: DC | PRN
  Administered 2015-05-11 (×2): 25 ug via INTRAVENOUS
  Filled 2015-05-11 (×2): qty 0.5

## 2015-05-11 MED ORDER — GLYCOPYRROLATE 0.2 MG/ML IJ SOLN
INTRAMUSCULAR | Status: DC | PRN
  Administered 2015-05-11: 0.4 mg via INTRAVENOUS
  Administered 2015-05-11: 0.2 mg via INTRAVENOUS

## 2015-05-11 MED ORDER — SUCCINYLCHOLINE CHLORIDE 20 MG/ML IJ SOLN
INTRAMUSCULAR | Status: DC | PRN
  Administered 2015-05-11: 100 mg via INTRAVENOUS

## 2015-05-11 MED ORDER — LIDOCAINE HCL (PF) 1 % IJ SOLN
INTRAMUSCULAR | Status: AC
  Filled 2015-05-11: qty 30

## 2015-05-11 MED ORDER — ONDANSETRON HCL 4 MG/2ML IJ SOLN
INTRAMUSCULAR | Status: DC | PRN
  Administered 2015-05-11: 4 mg via INTRAVENOUS

## 2015-05-11 MED ORDER — PROPOFOL 10 MG/ML IV BOLUS
INTRAVENOUS | Status: DC | PRN
  Administered 2015-05-11: 200 mg via INTRAVENOUS
  Administered 2015-05-11: 50 mg via INTRAVENOUS

## 2015-05-11 SURGICAL SUPPLY — 51 items
APPLIER CLIP ROT 10 11.4 M/L (STAPLE) ×3
BAG COUNTER SPONGE EZ (MISCELLANEOUS) ×2 IMPLANT
BENZOIN TINCTURE PRP APPL 2/3 (GAUZE/BANDAGES/DRESSINGS) ×3 IMPLANT
BLADE CLIPPER SURG (BLADE) ×3 IMPLANT
BLADE SURG SZ11 CARB STEEL (BLADE) ×3 IMPLANT
BULB RESERV EVAC DRAIN JP 100C (MISCELLANEOUS) IMPLANT
CANISTER SUCT 1200ML W/VALVE (MISCELLANEOUS) ×3 IMPLANT
CATH REDDICK CHOLANGI 4FR 50CM (CATHETERS) IMPLANT
CHLORAPREP W/TINT 26ML (MISCELLANEOUS) ×3 IMPLANT
CLIP APPLIE ROT 10 11.4 M/L (STAPLE) ×1 IMPLANT
CLOSURE WOUND 1/2 X4 (GAUZE/BANDAGES/DRESSINGS) ×1
CONRAY 60ML FOR OR (MISCELLANEOUS) IMPLANT
COUNTER SPONGE BAG EZ (MISCELLANEOUS) ×1
DISSECTOR KITTNER STICK (MISCELLANEOUS) IMPLANT
DISSECTORS/KITTNER STICK (MISCELLANEOUS)
DRAIN CHANNEL JP 19F (MISCELLANEOUS) IMPLANT
DRAPE SHEET LG 3/4 BI-LAMINATE (DRAPES) ×3 IMPLANT
DRSG TEGADERM 2-3/8X2-3/4 SM (GAUZE/BANDAGES/DRESSINGS) ×12 IMPLANT
DRSG TELFA 3X8 NADH (GAUZE/BANDAGES/DRESSINGS) ×3 IMPLANT
ENDOLOOP SUT PDS II  0 18 (SUTURE)
ENDOLOOP SUT PDS II 0 18 (SUTURE) IMPLANT
ENDOPOUCH RETRIEVER 10 (MISCELLANEOUS) ×3 IMPLANT
GLOVE BIO SURGEON STRL SZ7.5 (GLOVE) ×9 IMPLANT
GOWN STRL REUS W/ TWL LRG LVL3 (GOWN DISPOSABLE) ×3 IMPLANT
GOWN STRL REUS W/TWL LRG LVL3 (GOWN DISPOSABLE) ×6
IRRIGATION STRYKERFLOW (MISCELLANEOUS) ×1 IMPLANT
IRRIGATOR STRYKERFLOW (MISCELLANEOUS) ×3
IV CATH ANGIO 12GX3 LT BLUE (NEEDLE) IMPLANT
IV NS 1000ML (IV SOLUTION) ×2
IV NS 1000ML BAXH (IV SOLUTION) ×1 IMPLANT
LABEL OR SOLS (LABEL) ×3 IMPLANT
LIQUID BAND (GAUZE/BANDAGES/DRESSINGS) IMPLANT
NEEDLE HYPO 25X1 1.5 SAFETY (NEEDLE) ×3 IMPLANT
NS IRRIG 500ML POUR BTL (IV SOLUTION) ×3 IMPLANT
PACK LAP CHOLECYSTECTOMY (MISCELLANEOUS) ×3 IMPLANT
PAD GROUND ADULT SPLIT (MISCELLANEOUS) ×3 IMPLANT
SCISSORS METZENBAUM CVD 33 (INSTRUMENTS) ×3 IMPLANT
SEAL FOR SCOPE WARMER C3101 (MISCELLANEOUS) IMPLANT
SLEEVE ENDOPATH XCEL 5M (ENDOMECHANICALS) ×3 IMPLANT
STRAP SAFETY BODY (MISCELLANEOUS) ×3 IMPLANT
STRIP CLOSURE SKIN 1/2X4 (GAUZE/BANDAGES/DRESSINGS) ×2 IMPLANT
SUT MNCRL 4-0 (SUTURE) ×2
SUT MNCRL 4-0 27XMFL (SUTURE) ×1
SUT VICRYL 0 AB UR-6 (SUTURE) ×3 IMPLANT
SUTURE MNCRL 4-0 27XMF (SUTURE) ×1 IMPLANT
SWABSTK COMLB BENZOIN TINCTURE (MISCELLANEOUS) ×3 IMPLANT
TROCAR XCEL BLUNT TIP 100MML (ENDOMECHANICALS) ×3 IMPLANT
TROCAR XCEL NON-BLD 11X100MML (ENDOMECHANICALS) ×3 IMPLANT
TROCAR XCEL NON-BLD 5MMX100MML (ENDOMECHANICALS) ×3 IMPLANT
TUBING INSUFFLATOR HI FLOW (MISCELLANEOUS) ×3 IMPLANT
WATER STERILE IRR 1000ML POUR (IV SOLUTION) ×3 IMPLANT

## 2015-05-11 NOTE — Progress Notes (Signed)
Surgery Progress Note  S: No pain.   O:Blood pressure 134/87, pulse 70, temperature 97.8 F (36.6 C), temperature source Oral, resp. rate 16, height 5\' 8"  (1.727 m), weight 167 lb (75.751 kg), SpO2 99 %. GEN: NAD/A&Ox3 ABD: soft, nontender, nondistended  Labs: Bili 1.6  A/P 24 yo with probable choledocholithiasis s/p stent. Labs improving, pain improving.  Will plan on prophylactic cholecystectomy.  Will need ERCP in 2-3 weeks to reevaluate biliary system.

## 2015-05-11 NOTE — Anesthesia Preprocedure Evaluation (Addendum)
Anesthesia Evaluation  Patient identified by MRN, date of birth, ID band Patient awake    Reviewed: Allergy & Precautions, NPO status , Patient's Chart, lab work & pertinent test results  Airway Mallampati: II  TM Distance: >3 FB     Dental  (+) Chipped   Pulmonary neg pulmonary ROS,    Pulmonary exam normal breath sounds clear to auscultation       Cardiovascular negative cardio ROS Normal cardiovascular exam     Neuro/Psych negative neurological ROS  negative psych ROS   GI/Hepatic (+)     substance abuse  alcohol use and marijuana use,   Endo/Other  negative endocrine ROS  Renal/GU negative Renal ROS  negative genitourinary   Musculoskeletal negative musculoskeletal ROS (+)   Abdominal Normal abdominal exam  (+)   Peds negative pediatric ROS (+)  Hematology negative hematology ROS (+)   Anesthesia Other Findings   Reproductive/Obstetrics                             Anesthesia Physical Anesthesia Plan  ASA: II and emergent  Anesthesia Plan: General   Post-op Pain Management:    Induction: Intravenous  Airway Management Planned: Oral ETT  Additional Equipment:   Intra-op Plan:   Post-operative Plan: Extubation in OR  Informed Consent: I have reviewed the patients History and Physical, chart, labs and discussed the procedure including the risks, benefits and alternatives for the proposed anesthesia with the patient or authorized representative who has indicated his/her understanding and acceptance.   Dental advisory given  Plan Discussed with: CRNA and Surgeon  Anesthesia Plan Comments:        Anesthesia Quick Evaluation

## 2015-05-11 NOTE — Addendum Note (Signed)
Addendum  created 05/11/15 1205 by Yves DillPaul Kharis Lapenna, MD   Modules edited: Clinical Notes   Clinical Notes:  File: [id number]

## 2015-05-11 NOTE — Brief Op Note (Signed)
05/08/2015 - 05/11/2015  9:21 AM  PATIENT:  Jerry Chandler  24 y.o. male  PRE-OPERATIVE DIAGNOSIS:  choledocholithiasis  POST-OPERATIVE DIAGNOSIS:  same  PROCEDURE:  Procedure(s): LAPAROSCOPIC CHOLECYSTECTOMY (N/A)  SURGEON:  Surgeon(s) and Role:    * Ida Roguehristopher Dniyah Grant, MD - Primary  PHYSICIAN ASSISTANT:   ASSISTANTS: none   ANESTHESIA:   general  EBL:  Total I/O In: 2000 [I.V.:2000] Out: 325 [Urine:300; Blood:25]  BLOOD ADMINISTERED:none  DRAINS: none   LOCAL MEDICATIONS USED:  LIDOCAINE   SPECIMEN:  Excision  DISPOSITION OF SPECIMEN:  PATHOLOGY  COUNTS:  YES  TOURNIQUET:  * No tourniquets in log *  DICTATION: .Note written in EPIC  PLAN OF CARE: Admit for overnight observation  PATIENT DISPOSITION:  PACU - hemodynamically stable.   Delay start of Pharmacological VTE agent (>24hrs) due to surgical blood loss or risk of bleeding: not applicable

## 2015-05-11 NOTE — Op Note (Signed)
Preop dx: Choledocholithiasis Postop dx: Same Procedure performed: Laparoscopic cholecystectomy Anesthesia: General EBL: 10 ml Complications: None Specimen: gallbladder  Indication for surgery: [name] is a pleasant 24 yo M who presents with RUQ pain, hyperbilirubinemia and gallstones s/p ERCP with sphincterotomy and stent placement. He was brought to the OR for prophylactic cholecystectomy.  Details of surgery: Informed consent was obtained.  [name] was brought to the OR suite and laid supine on the OR table.  He was induced, ETT was placed, general anesthesia was administered.  His abdomen was prepped and draped.  A timeout was performed correctly identifying patient name, operative site and procedure to be performed.  A supraumbilical incision was made and deepened to the fascia.  The fascia was incised, peritoneum was entered.  Two stay sutures were placed through the fasciotomy.  Hassan trocar was placed and abdomen was insufflated.  An 11mm epigastric and 2 5 mm subcostal trocars were placed.  Gallbladder was minimally inflamed.  Cystic duct and cystic artery were dissected out and critical view was obtained.  Cystic artery and cystic duct were clipped and ligated.  Gallbladder was then taken off fossa and removed through umbilicus. Fossa was made hemostatic and irrigated until hemostasis obtained.  Trocars were then removed and abdomen desufflated.  Supraumbilical fascia was then closed with previously placed stay sutures.  Skin was then closed with interrupted deep dermal 4-0 monocryl.  Suture strips, telfa and tegaderm were used to dress incision.  Patient was then awoken, extubated and brought to PACU.  There were no immediate complications. Needle, sponge and instrument count was correct at the end of the procedure.

## 2015-05-11 NOTE — Anesthesia Procedure Notes (Signed)
Procedure Name: Intubation Date/Time: 05/11/2015 8:11 AM Performed by: Clovis FredricksonRISSON, Monaca Wadas Pre-anesthesia Checklist: Patient identified, Emergency Drugs available, Suction available, Patient being monitored and Timeout performed Patient Re-evaluated:Patient Re-evaluated prior to inductionOxygen Delivery Method: Circle system utilized Preoxygenation: Pre-oxygenation with 100% oxygen Intubation Type: IV induction and Cricoid Pressure applied Ventilation: Mask ventilation without difficulty Laryngoscope Size: Mac and 4 Grade View: Grade I Tube type: Oral Tube size: 7.0 mm Number of attempts: 1 Airway Equipment and Method: Stylet Placement Confirmation: ETT inserted through vocal cords under direct vision,  positive ETCO2,  CO2 detector and breath sounds checked- equal and bilateral Secured at: 22 cm Tube secured with: Tape Dental Injury: Teeth and Oropharynx as per pre-operative assessment

## 2015-05-11 NOTE — Addendum Note (Signed)
Addendum  created 05/11/15 1203 by Yves DillPaul Bryson Palen, MD   Modules edited: Anesthesia Review and Sign Navigator Section, Clinical Notes   Clinical Notes:  File: [id number]; File: [id number]

## 2015-05-11 NOTE — Anesthesia Postprocedure Evaluation (Signed)
  Anesthesia Post-op Note  Patient: Jerry Chandler  Procedure(s) Performed: Procedure(s): LAPAROSCOPIC CHOLECYSTECTOMY (N/A)  Anesthesia type:General  Patient location: PACU  Post pain: Pain level controlled  Post assessment: Post-op Vital signs reviewed, Patient's Cardiovascular Status Stable, Respiratory Function Stable, Patent Airway and No signs of Nausea or vomiting  Post vital signs: Reviewed and stable  Last Vitals:  Filed Vitals:   05/11/15 0940  BP: 108/57  Pulse: 56  Temp:   Resp: 12    Level of consciousness: awake, alert  and patient cooperative  Complications: No apparent anesthesia complications

## 2015-05-11 NOTE — Progress Notes (Signed)
Pt d/c to home today.  IV removed intact.  Rx's given to pt w/all questions and concerns addressed.  D/C paperwork reviewed and education provided with all questions and concerns addressed.  Pt roommate at bedside for home transport.  Pt given instructions on how to monitor for signs and symptoms of infection and how to reinforce incisional dressings, if drainage occurs.

## 2015-05-11 NOTE — Transfer of Care (Signed)
Immediate Anesthesia Transfer of Care Note  Patient: Jerry Chandler  Procedure(s) Performed: Procedure(s): LAPAROSCOPIC CHOLECYSTECTOMY (N/A)  Patient Location: PACU  Anesthesia Type:General  Level of Consciousness: awake, alert  and oriented  Airway & Oxygen Therapy: Patient Spontanous Breathing and Patient connected to nasal cannula oxygen  Post-op Assessment: Report given to RN and Post -op Vital signs reviewed and stable  Post vital signs: Reviewed and stable  Last Vitals:  Filed Vitals:   05/11/15 0939  BP:   Pulse: 53  Temp: 36.3 C  Resp: 11    Complications: No apparent anesthesia complications

## 2015-05-11 NOTE — Discharge Instructions (Signed)
Do not drive on pain medications °Do not lift greater than 15 lbs for a period of 6 weeks °Call or return to ER if you develop fever greater than 101.5, nausea/vomiting, increased pain, redness/drainage from incisions °Take bandages off in 48 hours.  Okay to shower with bandages on or after they come off, no tub baths °

## 2015-05-12 ENCOUNTER — Telehealth: Payer: Self-pay

## 2015-05-12 ENCOUNTER — Encounter: Payer: Self-pay | Admitting: Surgery

## 2015-05-12 NOTE — Discharge Summary (Signed)
Physician Discharge Summary  Patient ID: Jerry Chandler MRN: [id number] DOB/AGE: [birth year] 21 y.o.  Admit date: 05/08/2015 Discharge date: 05/12/2015  Admission Diagnoses: Choledocholithiasis  Discharge Diagnoses:  Active Problems:   Choledocholithiasis   Calculus of bile duct with obstruction and without cholangitis or cholecystitis   Discharged Condition: good  Hospital Course: Jerry Chandler was admitted with hyperbilirubinemia.  GI was consulted and patient underwent ERCP for which no stones were extracted and biliary stent was placed.  His bilirubin improved and then underwent unremarkable cholecystectomy.  Postoperatively, patient was advanced from clear liquid diet to regular diet and from IV pain medications to oral.  At time of discharge, Jerry Chandler was tolerating a regular diet with good oral pain control.  Consults: GI  Significant Diagnostic Studies:   Treatments: ERCP with stone placement (Dr. Bluford Kaufmannh), laparoscopic cholecystectomy  Discharge Exam: Blood pressure 155/89, pulse 67, temperature 98.2 F (36.8 C), temperature source Oral, resp. rate 16, height 5\' 8"  (1.727 m), weight 167 lb (75.751 kg), SpO2 100 %. GEN: NAD/A&Ox3 ABD: soft, nontender, nondistended, dressings c/d/i  Disposition: 01-Home or Self Care     Medication List    TAKE these medications        HYDROcodone-acetaminophen 5-325 MG tablet  Commonly known as:  NORCO/VICODIN  Take 1-2 tablets by mouth every 4 (four) hours as needed for moderate pain.     LORazepam 0.5 MG tablet  Commonly known as:  ATIVAN  Take 0.5 mg by mouth every 8 (eight) hours as needed for anxiety.     ondansetron 4 MG disintegrating tablet  Commonly known as:  ZOFRAN ODT  Take 1 tablet (4 mg total) by mouth every 8 (eight) hours as needed for nausea or vomiting.     oxyCODONE-acetaminophen 5-325 MG tablet  Commonly known as:  PERCOCET/ROXICET  Take 1 tablet by mouth every 8 (eight) hours as needed.     venlafaxine XR 75 MG 24 hr capsule  Commonly known as:  EFFEXOR-XR  Take 75 mg by mouth daily.           Follow-up Information    [facility]. Schedule an appointment as soon as possible for a visit in 1 week.      Follow up with OH, Ezzard StandingPAUL Y, MD. Schedule an appointment as soon as possible for a visit in 2 weeks.   Specialty:  Internal Medicine   Contact information:   [phone] [facility] [address] [facility] [place] [id number] [phone]       Signed: Ida RogueChristopher Landy Dunnavant 05/12/2015, 9:05 AM

## 2015-05-12 NOTE — Telephone Encounter (Signed)
Post-discharge call made to patient at this time. No answer. Left voicemail for return phone call.  

## 2015-05-12 NOTE — Telephone Encounter (Signed)
Called once again to patient at this time. Pain is controlled at this time, only using Ibuprofen. Bowels moving normally. No questions or concerns.   Scheduled to follow-up with Dr. Lutricia FeilPaul Oh (s/p ERCP) the week of 12/5 per patient.  Made post-op appointment. Had to make on post-op day 17 due to patient being out of town for Thanksgiving.   Encouraged patient to call with any questions that arise prior to appointment.

## 2015-05-13 LAB — SURGICAL PATHOLOGY

## 2015-05-14 ENCOUNTER — Encounter: Payer: Self-pay | Admitting: Gastroenterology

## 2015-05-28 ENCOUNTER — Encounter: Payer: Self-pay | Admitting: Surgery

## 2015-07-24 ENCOUNTER — Encounter: Payer: Self-pay | Admitting: *Deleted

## 2015-07-25 ENCOUNTER — Ambulatory Visit: Payer: BLUE CROSS/BLUE SHIELD | Admitting: Anesthesiology

## 2015-07-25 ENCOUNTER — Encounter: Payer: Self-pay | Admitting: *Deleted

## 2015-07-25 ENCOUNTER — Ambulatory Visit: Payer: BLUE CROSS/BLUE SHIELD

## 2015-07-25 ENCOUNTER — Ambulatory Visit
Admission: RE | Admit: 2015-07-25 | Discharge: 2015-07-25 | Disposition: A | Discharge status: Home / Self Care | Payer: BLUE CROSS/BLUE SHIELD | Source: Ambulatory Visit | Attending: Gastroenterology | Admitting: Gastroenterology

## 2015-07-25 ENCOUNTER — Encounter
Admission: RE | Disposition: A | Discharge status: Home / Self Care | Payer: Self-pay | Source: Ambulatory Visit | Attending: Gastroenterology

## 2015-07-25 DIAGNOSIS — Z4689 Encounter for fitting and adjustment of other specified devices: Secondary | ICD-10-CM | POA: Diagnosis not present

## 2015-07-25 DIAGNOSIS — Z803 Family history of malignant neoplasm of breast: Secondary | ICD-10-CM | POA: Diagnosis not present

## 2015-07-25 DIAGNOSIS — Z8349 Family history of other endocrine, nutritional and metabolic diseases: Secondary | ICD-10-CM | POA: Diagnosis not present

## 2015-07-25 DIAGNOSIS — Z91048 Other nonmedicinal substance allergy status: Secondary | ICD-10-CM | POA: Insufficient documentation

## 2015-07-25 DIAGNOSIS — Z79899 Other long term (current) drug therapy: Secondary | ICD-10-CM | POA: Diagnosis not present

## 2015-07-25 DIAGNOSIS — K219 Gastro-esophageal reflux disease without esophagitis: Secondary | ICD-10-CM | POA: Diagnosis not present

## 2015-07-25 DIAGNOSIS — Z9049 Acquired absence of other specified parts of digestive tract: Secondary | ICD-10-CM | POA: Insufficient documentation

## 2015-07-25 DIAGNOSIS — Z9889 Other specified postprocedural states: Secondary | ICD-10-CM | POA: Diagnosis not present

## 2015-07-25 DIAGNOSIS — F329 Major depressive disorder, single episode, unspecified: Secondary | ICD-10-CM | POA: Diagnosis not present

## 2015-07-25 DIAGNOSIS — Z885 Allergy status to narcotic agent status: Secondary | ICD-10-CM | POA: Insufficient documentation

## 2015-07-25 DIAGNOSIS — F419 Anxiety disorder, unspecified: Secondary | ICD-10-CM | POA: Insufficient documentation

## 2015-07-25 DIAGNOSIS — Z79891 Long term (current) use of opiate analgesic: Secondary | ICD-10-CM | POA: Insufficient documentation

## 2015-07-25 HISTORY — DX: Depression, unspecified: F32.A

## 2015-07-25 HISTORY — DX: Anxiety disorder, unspecified: F41.9

## 2015-07-25 HISTORY — DX: Gastro-esophageal reflux disease without esophagitis: K21.9

## 2015-07-25 HISTORY — DX: Major depressive disorder, single episode, unspecified: F32.9

## 2015-07-25 HISTORY — DX: Unspecified convulsions: R56.9

## 2015-07-25 HISTORY — PX: ERCP: SHX5425

## 2015-07-25 SURGERY — ERCP, WITH INTERVENTION IF INDICATED
Anesthesia: General

## 2015-07-25 MED ORDER — SODIUM CHLORIDE 0.9 % IV SOLN: INTRAVENOUS | Status: DC

## 2015-07-25 MED ORDER — PROPOFOL 10 MG/ML IV BOLUS
INTRAVENOUS | Status: DC | PRN
  Administered 2015-07-25: 450 mg via INTRAVENOUS

## 2015-07-25 MED ORDER — FENTANYL CITRATE (PF) 100 MCG/2ML IJ SOLN
INTRAMUSCULAR | Status: DC | PRN
  Administered 2015-07-25 (×2): 50 ug via INTRAVENOUS

## 2015-07-25 MED ORDER — ACETAMINOPHEN 500 MG PO TABS
ORAL_TABLET | ORAL | Status: AC
  Filled 2015-07-25: qty 2

## 2015-07-25 MED ORDER — CEFAZOLIN SODIUM 1-5 GM-% IV SOLN
1.0000 g | INTRAVENOUS | Status: AC
  Administered 2015-07-25: 1 g via INTRAVENOUS
  Filled 2015-07-25: qty 50

## 2015-07-25 MED ORDER — ACETAMINOPHEN 325 MG PO TABS
650.0000 mg | ORAL_TABLET | Freq: Once | ORAL | Status: AC
  Administered 2015-07-25: 650 mg via ORAL
  Filled 2015-07-25: qty 2

## 2015-07-25 MED ORDER — SODIUM CHLORIDE 0.9 % IV SOLN
INTRAVENOUS | Status: DC
  Administered 2015-07-25: 11:00:00 via INTRAVENOUS

## 2015-07-25 NOTE — Transfer of Care (Signed)
Immediate Anesthesia Transfer of Care Note  Patient: Jerry Chandler  Procedure(s) Performed: Procedure(s): ENDOSCOPIC RETROGRADE CHOLANGIOPANCREATOGRAPHY (ERCP) (N/A)  Patient Location: PACU  Anesthesia Type:General  Level of Consciousness: awake, alert  and oriented  Airway & Oxygen Therapy: Patient Spontanous Breathing and Patient connected to nasal cannula oxygen  Post-op Assessment: Report given to RN and Post -op Vital signs reviewed and stable  Post vital signs: Reviewed and stable  Last Vitals:  Filed Vitals:   07/25/15 1029  BP: 133/86  Pulse: 91  Temp: 36.3 C  Resp: 17    Complications: No apparent anesthesia complications

## 2015-07-25 NOTE — Op Note (Signed)
[facility] Gastroenterology Patient Name: Jerry Chandler Procedure Date: 07/25/2015 11:20 AM MRN: [id number] Account #: [id number] Date of Birth: [birth year] Admit Type: Outpatient Age: 25 Room: [facility] ENDO ROOM 4 Gender: Male Note Status: Finalized Procedure:         ERCP Indications:       Stent removal Providers:         Ezzard Standing. Bluford Kaufmann, MD Referring MD:      Sallye Lat Md, MD (Referring MD) Medicines:         Monitored Anesthesia Care Complications:     No immediate complications. Procedure:         Pre-Anesthesia Assessment:                    - Prior to the procedure, a History and Physical was                     performed, and patient medications, allergies and                     sensitivities were reviewed. The patient's tolerance of                     previous anesthesia was reviewed.                    - The risks and benefits of the procedure and the sedation                     options and risks were discussed with the patient. All                     questions were answered and informed consent was obtained.                    - After reviewing the risks and benefits, the patient was                     deemed in satisfactory condition to undergo the procedure.                    After obtaining informed consent, the scope was passed                     under direct vision. Throughout the procedure, the                     patient's blood pressure, pulse, and oxygen saturations                     were monitored continuously. The ERCP was introduced                     through the mouth, and used to inject contrast into and                     used for direct visualization of the bile duct. The ERCP                     was accomplished without difficulty. The patient tolerated                     the procedure well. Findings:      The scout film was normal. The esophagus was successfully intubated  under direct vision. The scope was advanced to a  normal major papilla in       the descending duodenum without detailed examination of the pharynx,       larynx and associated structures, and upper GI tract. The upper GI tract       was grossly normal. One stent was removed from the common bile duct       using a snare. The stent was found to be partially occluded via the       water column test. The bile duct was deeply cannulated with the       short-nosed traction sphincterotome. Contrast was injected. I personally       interpreted the bile duct images. Ductal flow of contrast was adequate.       Image quality was adequate. Contrast extended to the entire biliary       tree. Air bubbles were visualized in the upper third of the main bile       duct. A straight Roadrunner wire was passed into the biliary tree. To       discover objects, the biliary tree was swept with a 13.5 mm balloon       starting at the bifurcation. Nothing was found. Nothing was found. Impression:        - One stent was removed from the common bile duct.                    - Bubbles were found in the biliary tract.                    - The biliary tree was swept and nothing was found.                    - No stenosis present now. No stones remaining in the bile                     duct. Recommendation:    - Discharge patient to home.                    - The findings and recommendations were discussed with the                     patient. Procedure Code(s): --- Professional ---                    [phone], Endoscopic retrograde cholangiopancreatography                     (ERCP); with removal of foreign body(s) or stent(s) from                     biliary/pancreatic duct(s) Diagnosis Code(s): --- Professional ---                    Z46.59, Encounter for fitting and adjustment of other                     gastrointestinal appliance and device                    R93.2, Abnormal findings on diagnostic imaging of liver                     and biliary tract CPT  copyright 2014 American Medical Association. All rights reserved. The codes documented  in this report are preliminary and upon coder review may  be revised to meet current compliance requirements. Wallace Cullens, MD 07/25/2015 11:56:58 AM This report has been signed electronically. Number of Addenda: 0 Note Initiated On: 07/25/2015 11:20 AM      [facility]

## 2015-07-25 NOTE — Anesthesia Preprocedure Evaluation (Signed)
Anesthesia Evaluation  Patient identified by MRN, date of birth, ID band Patient awake    Reviewed: Allergy & Precautions, H&P , NPO status , Patient's Chart, lab work & pertinent test results  History of Anesthesia Complications Negative for: history of anesthetic complications  Airway Mallampati: II  TM Distance: >3 FB Neck ROM: full    Dental  (+) Poor Dentition, Chipped   Pulmonary neg pulmonary ROS, neg shortness of breath,    Pulmonary exam normal breath sounds clear to auscultation       Cardiovascular Exercise Tolerance: Good (-) angina(-) Past MI and (-) DOE Normal cardiovascular exam Rhythm:regular Rate:Normal     Neuro/Psych Seizures -, Well Controlled,  PSYCHIATRIC DISORDERS Anxiety Depression    GI/Hepatic Neg liver ROS, GERD  Controlled,  Endo/Other  negative endocrine ROS  Renal/GU negative Renal ROS  negative genitourinary   Musculoskeletal   Abdominal   Peds  Hematology negative hematology ROS (+)   Anesthesia Other Findings Past Medical History:   Rib fractures                                                  Comment:secondary to vomiting with Norovirus   Depression                                                   Anxiety                                                      GERD (gastroesophageal reflux disease)                       Seizures (HCC)                                  2016        Past Surgical History:   FACIAL FRACTURE SURGERY                          2009         Left foot surgery                                2012         gum graft                                        2015           Comment:x 2   CHOLECYSTECTOMY                                 N/A 05/11/2015     Comment:Procedure: LAPAROSCOPIC CHOLECYSTECTOMY;  Surgeon: Ida Rogue, MD;  Location:               [facility] ORS;  Service: General;  Laterality: N/A;   ERCP                                             Left 05/09/2015     Comment:Procedure: ENDOSCOPIC RETROGRADE               CHOLANGIOPANCREATOGRAPHY (ERCP);  Surgeon: Wallace Cullens, MD;  Location: [facility] ENDOSCOPY;  Service:               Endoscopy;  Laterality: Left;  BMI    Body Mass Index   25.85 kg/m 2    Patient is NPO appropriate and reports no nausea or vomiting today.     Reproductive/Obstetrics negative OB ROS                             Anesthesia Physical Anesthesia Plan  ASA: III  Anesthesia Plan: General   Post-op Pain Management:    Induction:   Airway Management Planned:   Additional Equipment:   Intra-op Plan:   Post-operative Plan:   Informed Consent: I have reviewed the patients History and Physical, chart, labs and discussed the procedure including the risks, benefits and alternatives for the proposed anesthesia with the patient or authorized representative who has indicated his/her understanding and acceptance.   Dental Advisory Given  Plan Discussed with: Anesthesiologist, CRNA and Surgeon  Anesthesia Plan Comments:         Anesthesia Quick Evaluation

## 2015-07-25 NOTE — Anesthesia Postprocedure Evaluation (Signed)
Anesthesia Post Note  Patient: Jerry Chandler  Procedure(s) Performed: Procedure(s) (LRB): ENDOSCOPIC RETROGRADE CHOLANGIOPANCREATOGRAPHY (ERCP) (N/A)  Patient location during evaluation: Endoscopy Anesthesia Type: General Level of consciousness: awake and alert Pain management: pain level controlled Vital Signs Assessment: post-procedure vital signs reviewed and stable Respiratory status: spontaneous breathing, nonlabored ventilation, respiratory function stable and patient connected to nasal cannula oxygen Cardiovascular status: blood pressure returned to baseline and stable Postop Assessment: no signs of nausea or vomiting Anesthetic complications: no    Last Vitals:  Filed Vitals:   07/25/15 1220 07/25/15 1230  BP: 149/93 142/101  Pulse: 50 49  Temp:    Resp: 19 15    Last Pain: There were no vitals filed for this visit.               Cleda Mccreedy Lyal Husted

## 2015-07-25 NOTE — H&P (Signed)
Primary Care Physician:  Pcp Not In System Primary Gastroenterologist:  Dr. Bluford Kaufmann  Pre-Procedure History & Physical: HPI:  Jerry Chandler is a 25 y.o. male is here for an ERCP.   Past Medical History  Diagnosis Date  . Rib fractures     secondary to vomiting with Norovirus  . Depression   . Anxiety   . GERD (gastroesophageal reflux disease)   . Seizures (HCC) 2016    Past Surgical History  Procedure Laterality Date  . Facial fracture surgery  2009  . Left foot surgery  2012  . Gum graft  2015    x 2  . Cholecystectomy N/A 05/11/2015    Procedure: LAPAROSCOPIC CHOLECYSTECTOMY;  Surgeon: Ida Rogue, MD;  Location: [facility] ORS;  Service: General;  Laterality: N/A;  . Ercp Left 05/09/2015    Procedure: ENDOSCOPIC RETROGRADE CHOLANGIOPANCREATOGRAPHY (ERCP);  Surgeon: Wallace Cullens, MD;  Location: [facility] ENDOSCOPY;  Service: Endoscopy;  Laterality: Left;    Prior to Admission medications   Medication Sig Start Date End Date Taking? Authorizing Provider  venlafaxine XR (EFFEXOR-XR) 75 MG 24 hr capsule Take 75 mg by mouth daily. 03/10/15  Yes Historical Provider, MD  HYDROcodone-acetaminophen (NORCO/VICODIN) 5-325 MG tablet Take 1-2 tablets by mouth every 4 (four) hours as needed for moderate pain. 05/11/15   Ida Rogue, MD  LORazepam (ATIVAN) 0.5 MG tablet Take 0.5 mg by mouth every 8 (eight) hours as needed for anxiety.    Historical Provider, MD  ondansetron (ZOFRAN ODT) 4 MG disintegrating tablet Take 1 tablet (4 mg total) by mouth every 8 (eight) hours as needed for nausea or vomiting. 05/05/15   Rockne Menghini, MD  oxyCODONE-acetaminophen (PERCOCET/ROXICET) 5-325 MG tablet Take 1 tablet by mouth every 8 (eight) hours as needed. 05/06/15   Historical Provider, MD    Allergies as of 07/24/2015 - Review Complete 07/24/2015  Allergen Reaction Noted  . Morphine Rash 05/08/2015  . Lactase  05/08/2015    Family History  Problem Relation Age of Onset  .  Hyperlipidemia Father   . Cancer Maternal Aunt     Breast    Social History   Social History  . Marital Status: Single    Spouse Name: N/A  . Number of Children: N/A  . Years of Education: N/A   Occupational History  . Not on file.   Social History Main Topics  . Smoking status: Never Smoker   . Smokeless tobacco: Never Used  . Alcohol Use: 21.6 oz/week    36 Shots of liquor, 0 Standard drinks or equivalent per week     Comment: Every other day  . Drug Use: 4.00 per week    Special: Marijuana  . Sexual Activity: Not on file   Other Topics Concern  . Not on file   Social History Narrative    Review of Systems: See HPI, otherwise negative ROS  Physical Exam: BP 133/86 mmHg  Pulse 91  Temp(Src) 97.4 F (36.3 C) (Tympanic)  Resp 17  Ht  (1.727 m)  Wt 77.111 kg (170 lb)  BMI 25.85 kg/m2  SpO2 99% General:   Alert,  pleasant and cooperative in NAD Head:  Normocephalic and atraumatic. Neck:  Supple; no masses or thyromegaly. Lungs:  Clear throughout to auscultation.    Heart:  Regular rate and rhythm. Abdomen:  Soft, nontender and nondistended. Normal bowel sounds, without guarding, and without rebound.   Neurologic:  Alert and  oriented x4;  grossly normal neurologically.  Impression/Plan:  RAJVIR ERNSTER is here for an ERCP to be performed for stent removal.  Risks, benefits, limitations, and alternatives regarding ERCP have been reviewed with the patient.  Questions have been answered.  All parties agreeable.   Malakie Balis, Ezzard Standing, MD  07/25/2015, 10:49 AM

## 2015-07-26 ENCOUNTER — Encounter: Payer: Self-pay | Admitting: Gastroenterology

## 2016-12-24 IMAGING — US US ABDOMEN LIMITED
1 series · 14 of 25 positions shown · non-contrast
Comparison: None.

CLINICAL DATA: Right upper quadrant abdominal pain for 1 week,
worsening today.

EXAM:
US ABDOMEN LIMITED - RIGHT UPPER QUADRANT

[Series 1: us abdomen limited · 0.18mm/px · 14 of 48 slices shown]
[im 1/48]
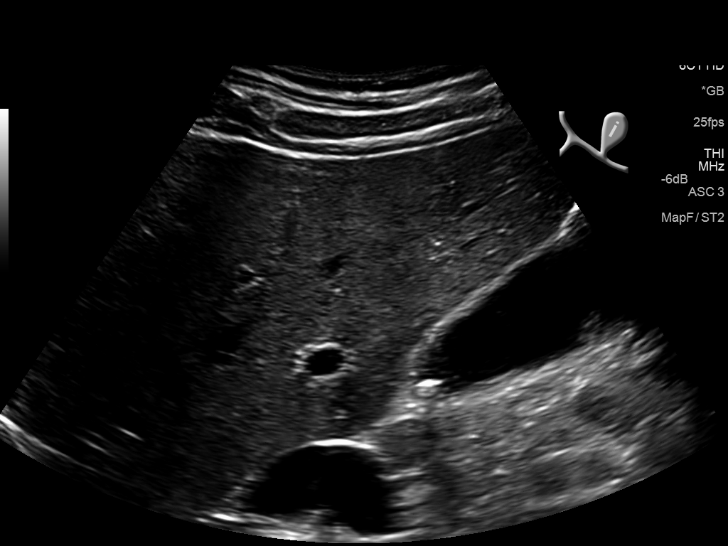
[im 4/48]
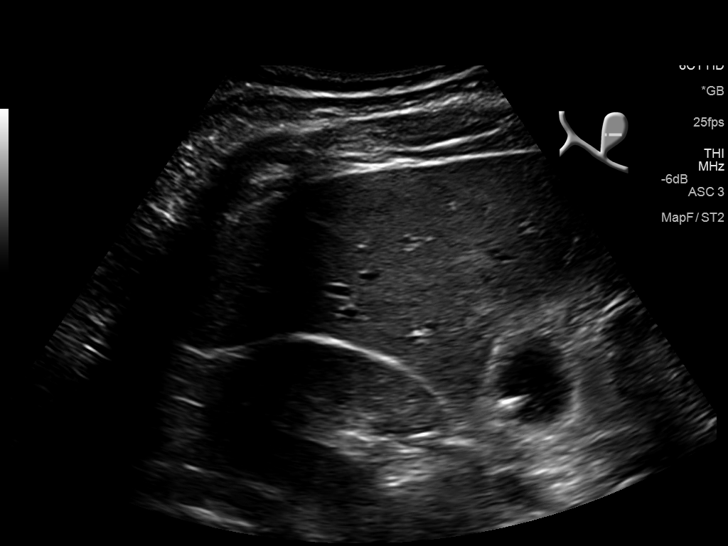
[im 8/48]
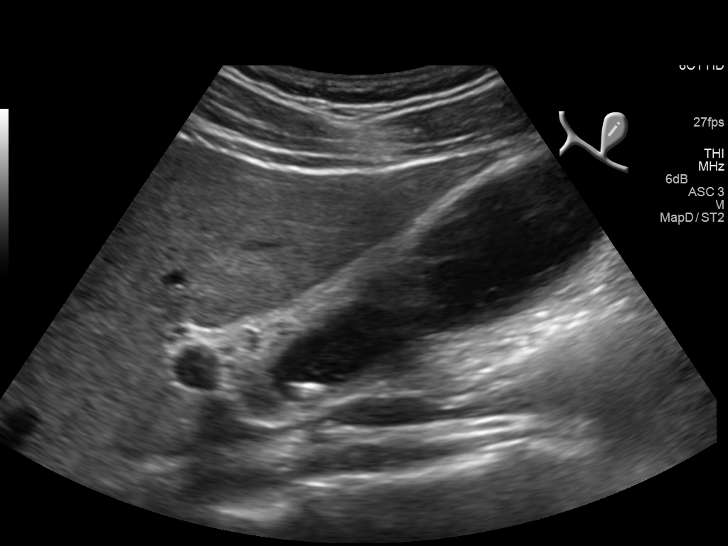
[im 12/48]
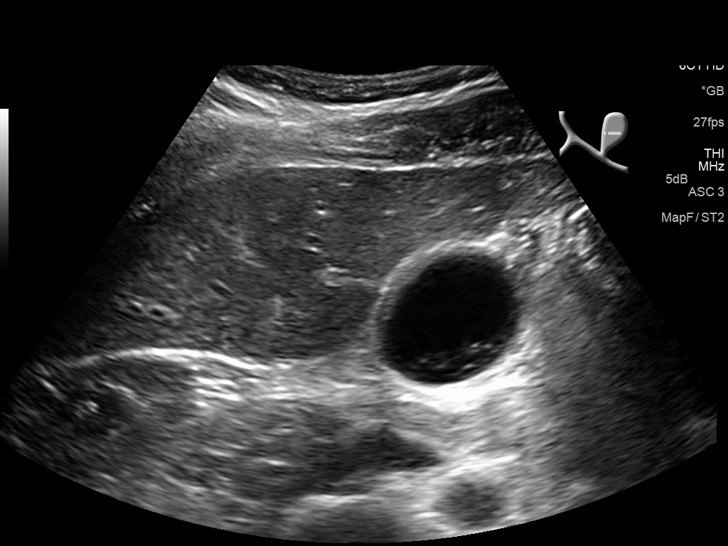
[im 16/48]
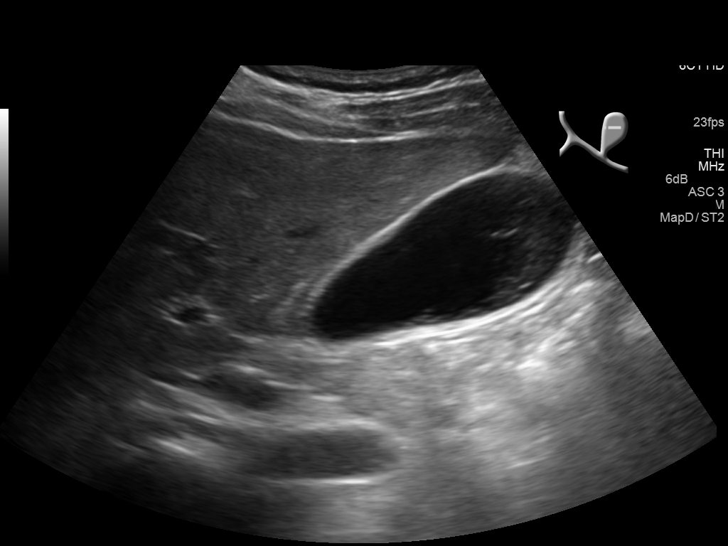
[im 18/48]
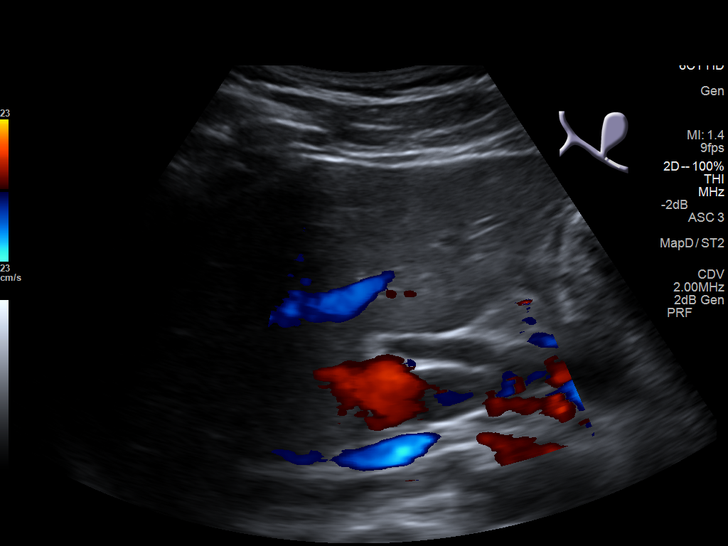
[im 22/48]
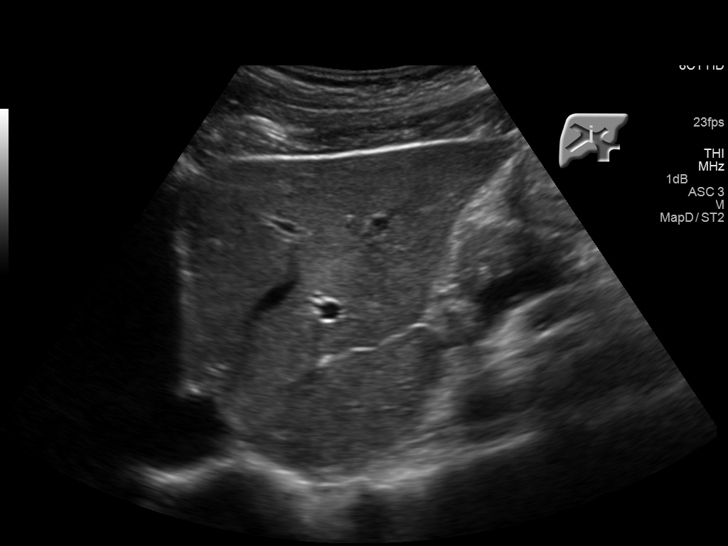
[im 26/48]
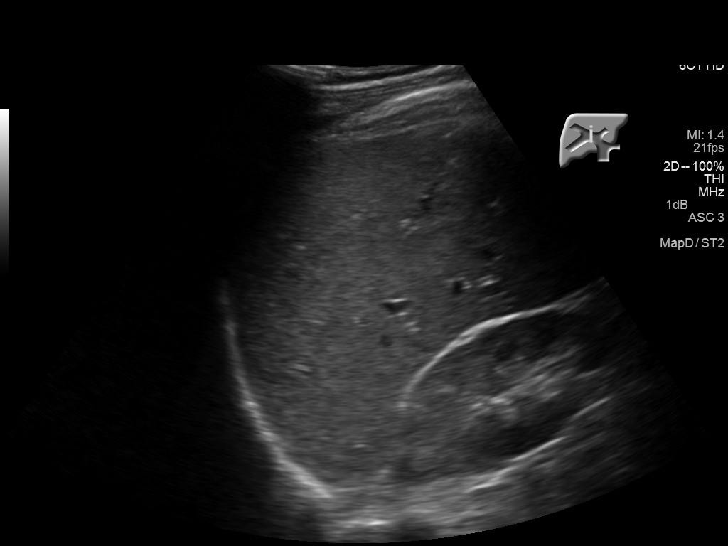
[im 30/48]
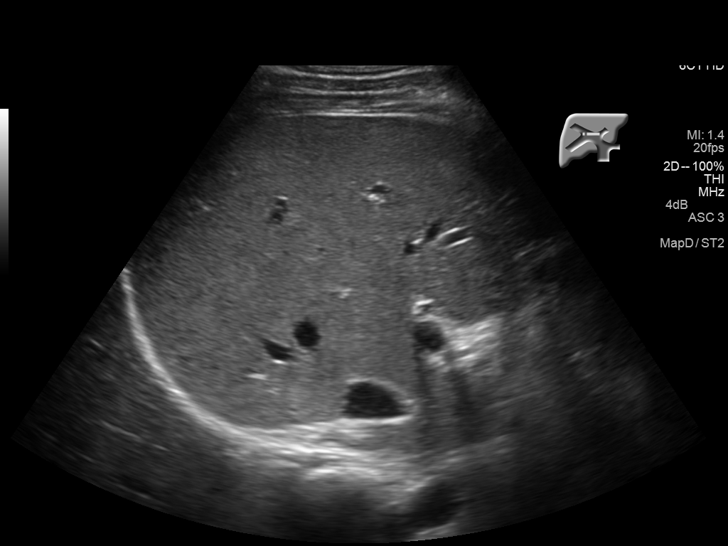
[im 32/48]
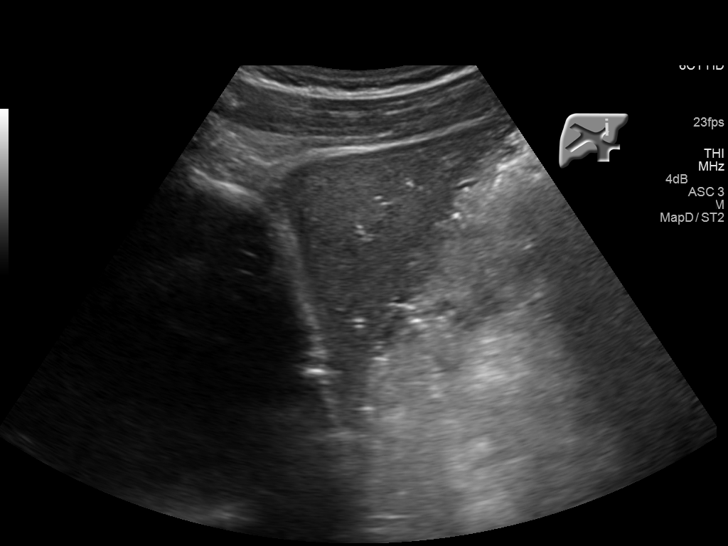
[im 36/48]
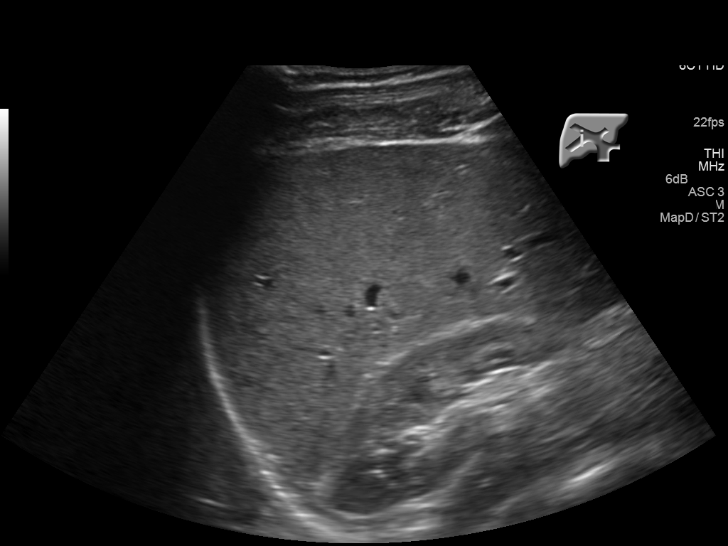
[im 40/48]
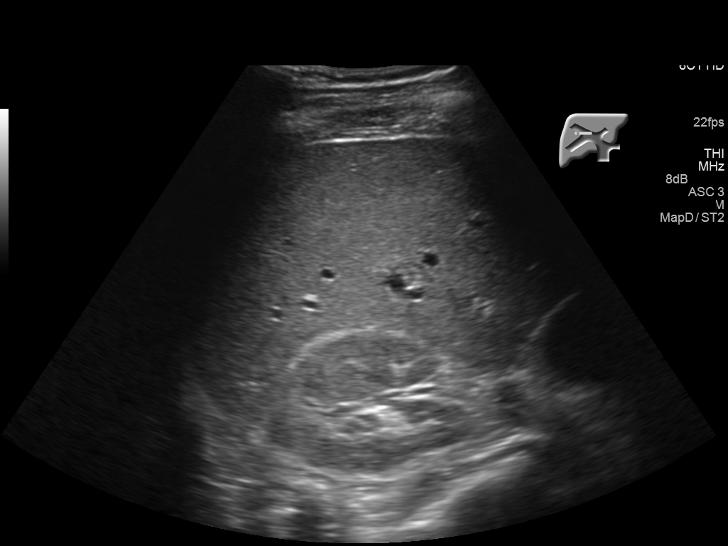
[im 44/48]
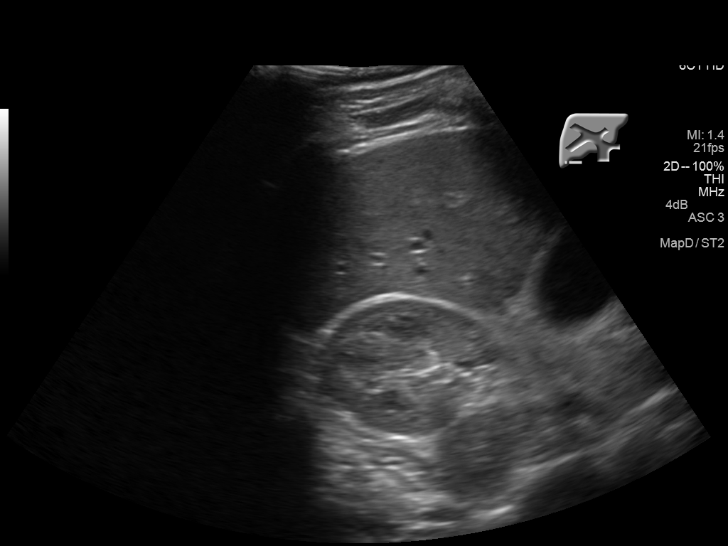
[im 48/48]
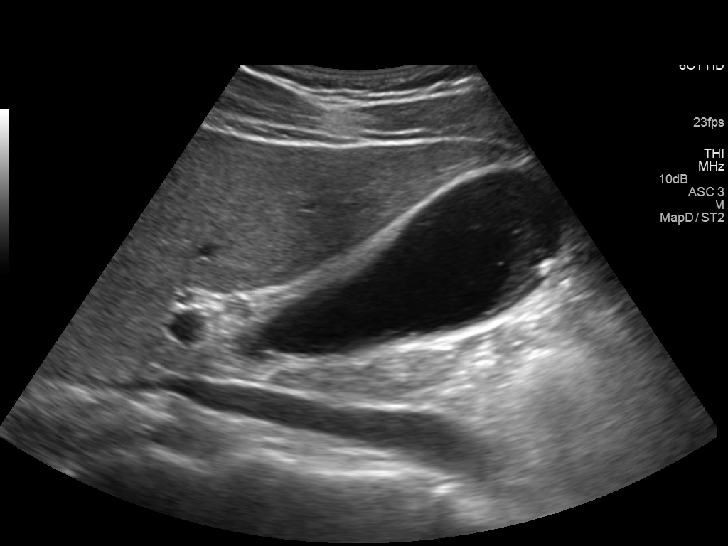

[14 of 25 positions shown; findings below may reference images not displayed]

FINDINGS: Gallbladder:

Numerous small layering gallstones and echogenic sludge. The largest
calculus measures 6 mm. Borderline gallbladder wall thickening and
slight irregularity but no pericholecystic fluid. Negative
sonographic Murphy sign.

Common bile duct:

Diameter: 4.4 mm

Liver:

Normal echogenicity without focal lesion or biliary dilatation.
IMPRESSION: Cholelithiasis with mild/early gallbladder wall thickening.

Normal caliber common bile duct and normal liver.

## 2020-01-26 ENCOUNTER — Ambulatory Visit: Admitting: Emergency Medicine

## 2020-01-26 ENCOUNTER — Emergency Department: Admit: 2020-01-26 | Discharge: 2020-01-26 | Disposition: A | Discharge status: Home / Self Care | Payer: PPO

## 2020-01-26 LAB — HX TOXICOLOGY-DRUG, SERUM
HX ACETAMINOPHEN: <3 ug/mL — ABNORMAL LOW (ref 10–20)
HX BARBITUATES QL, SERUM: NOT DETECTED
HX BENZODIAZEPINES QL, SERUM: NOT DETECTED
HX ETHANOL: <10 mg/dL
HX SALICYLATE: <5 mg/dL — ABNORMAL LOW (ref 15.0–29.9)
HX TCA: NOT DETECTED

## 2020-01-26 LAB — HX HEM-ROUTINE
HX BASO #: 0.1 10*3/uL (ref 0.0–0.2)
HX BASO: 0 %
HX EOSIN #: 0 10*3/uL (ref 0.0–0.5)
HX EOSIN: 0 %
HX HCT: 45.2 % (ref 37.0–47.0)
HX HGB: 16.2 g/dL — ABNORMAL HIGH (ref 13.5–16.0)
HX IMMATURE GRANULOCYTE#: 0.1 10*3/uL (ref 0.0–0.1)
HX IMMATURE GRANULOCYTE: 0 %
HX LYMPH #: 1.5 10*3/uL (ref 1.0–4.0)
HX LYMPH: 8 %
HX MCH: 30.9 pg (ref 26.0–34.0)
HX MCHC: 35.8 g/dL (ref 32.0–36.0)
HX MCV: 86.3 fL (ref 80.0–98.0)
HX MONO #: 0.6 10*3/uL (ref 0.2–0.8)
HX MONO: 3 %
HX MPV: 10.6 fL (ref 9.1–11.7)
HX NEUT #: 17.3 10*3/uL — ABNORMAL HIGH (ref 1.5–7.5)
HX NRBC #: 0 10*3/uL
HX NUCLEATED RBC: 0 %
HX PLT: 320 10*3/uL (ref 150–400)
HX RBC BLOOD COUNT: 5.24 M/uL (ref 4.20–5.50)
HX RDW: 11.4 % — ABNORMAL LOW (ref 11.5–14.5)
HX SEG NEUT: 89 %
HX WBC: 19.5 10*3/uL — ABNORMAL HIGH (ref 4.0–11.0)

## 2020-01-26 LAB — HX CHEM-PANELS
HX ANION GAP: 17 — ABNORMAL HIGH (ref 3–14)
HX BLOOD UREA NITROGEN: 18 mg/dL (ref 6–24)
HX CHLORIDE (CL): 102 meq/L (ref 98–110)
HX CO2: 21 meq/L (ref 20–30)
HX CREATININE (CR): 1.01 mg/dL (ref 0.57–1.30)
HX GFR, AFRICAN AMERICAN: 118 mL/min/{1.73_m2}
HX GFR, NON-AFRICAN AMERICAN: 102 mL/min/{1.73_m2}
HX GLUCOSE: 105 mg/dL (ref 70–139)
HX POTASSIUM (K): 3.2 meq/L — ABNORMAL LOW (ref 3.6–5.1)
HX SODIUM (NA): 140 meq/L (ref 135–145)

## 2020-01-26 LAB — HX COAGULATION
HX INR PT: 1.1 (ref 0.9–1.3)
HX PROTHROMBIN TIME: 12.9 s (ref 9.7–14.0)
HX PTT: 32.3 s (ref 25.7–35.7)

## 2020-01-26 LAB — HX CHEM-LFT
HX ALANINE AMINOTRANSFERASE (ALT/SGPT): 39 IU/L (ref 0–54)
HX ALKALINE PHOSPHATASE (ALK): 74 IU/L (ref 40–130)
HX ASPARTATE AMINOTRANFERASE (AST/SGOT): 32 IU/L (ref 10–42)
HX BILIRUBIN, DIRECT: 0.5 mg/dL (ref 0.0–0.5)
HX BILIRUBIN, TOTAL: 1.1 mg/dL (ref 0.2–1.1)

## 2020-01-26 LAB — HX DIABETES: HX GLUCOSE: 105 mg/dL (ref 70–139)

## 2020-01-26 LAB — HX TRANSFUSION

## 2020-01-26 LAB — HX CHEM-OTHER: HX LIPASE: 20 IU/L (ref 8–60)

## 2020-01-26 NOTE — ED Provider Notes (Signed)
 Jeremiah Porter  Name: Jeremiah Porter, Guile  MRN: [id number]  Age: 29 yrs  Sex: Male  DOB: [birth year]  Arrival Date: 01/26/2020  Arrival Time: 13:53  Account#: [id number]  .  Working Diagnosis: Vomiting  PCP: Forest Becker  .  HPI:  07/31  19:25 The patient reports nausea vomiting for about 24 hours. Is a    jms        history of recurrent similar problems. He sees a GI doctor For        this problem. The symptoms seem to defy diagnosis. he reports        occasional EtOH and marijuana use. he Reports no hematemesis or        bilious emesis. He reports no melena or BRBPR. He reports mild        epigastric pain..  .  Historical:  - Allergies: No known drug Allergies; lactose;  - Home Meds: effexor, mirtazepine, sulcrafate;  - PMHx: depression, stomach problems;  - Social history: Patient uses alcohol occasionally. Marijuana    Patient/guardian denies using IV drugs.  - The history from nurses notes was reviewed: and I agree with    what is documented.  .  .  ROS:  19:35 Constitutional: Positive for malaise, Negative for chills,      jms        fever.  19:35 Eyes: Negative for redness.  19:35 ENT: Negative for difficulty swallowing, sore throat.  19:35 Cardiovascular: Negative for chest pain, palpitations.  19:35 Respiratory: Negative for cough, shortness of breath.  19:35 Abdomen/GI: Positive for abdominal pain, nausea, vomiting,        Negative for diarrhea.  19:35 MS/extremity: Negative for pain, swelling.  19:35 Neuro: Negative for headache, weakness.  19:35 Psych: Negative for drug dependence, alcohol dependence.  .  Vital Signs:  14:09 Pulse 97 Monitor; Resp 20 Spontaneous; Temp 36.1(O); Pulse Ox   sl36        100% on R/A;  14:09                                                                 sl36  16:35 BP 133 / 34; Pulse 96; Resp 18 Spontaneous; Pulse Ox 100% ;     cc38  18:34 BP 133 / 83 Left Arm Sitting (auto/reg); Pulse 87 Monitor; Resp pd6        16 Spontaneous; Temp 37.1(O); Pulse Ox 97% on R/A;  19:32 BP 126 / 80; Pulse 78;  Resp 16; Temp 36.8; Pulse Ox 100% on     kq4        R/A; Pain 0/10;  14:09 unable to obtain BP in triage                                   sl36  .  Name:Jeremiah Porter, Jeremiah Porter  [id number]  [id number]  Page 1 of 4  %%PAGE  .  Name: Jeremiah Porter, Jeremiah Porter  MRN: [id number]  Age: 19 yrs  Sex: Male  DOB: 04-20-1991  Arrival Date: 01/26/2020  Arrival Time: 13:53  Account#: [id number]  .  Working Diagnosis: Vomiting  PCP: Forest Becker  .  Jeremiah Porter  Glasgow Coma Score:  14:16 Eye Response: spontaneous(4). Verbal  Response: oriented(5).     kh21        Motor Response: obeys commands(6). Total: 15.  .  Exam:  19:36 Constitutional: The patient appears well nourished, alert, well jms        developed, awake.  19:36 Head/face: Exam is negative for obvious evidence of injury or        deformity.  19:36 Eyes: Pupils: equal, round, and reactive to light. Conjunctiva:        normal.  19:36 ENT: Posterior pharynx: is normal, Voice: is normal.  19:36 Respiratory:  Respirations: normal, Breath sounds: are normal.  19:36 Cardiovascular: Rate: actual rate is  78 bpm.  19:36 Abdomen/GI: Palpation: abdomen is soft and non-tender.  19:36 Psych: Behavior/mood is cooperative, Affect is calm.  19:36 Neuro: Orientation: to person, place / time. Mentation: lucid,        able to follow commands, Cranial nerves: grossly normal, Motor:        is normal, Gait: is steady.  .  MDM:  19:37 Differential diagnosis: Vomiting, dehydration, abdominal pain.  jms        Data reviewed: nurses notes. Counseling: I had a detailed        discussion with the patient and/or guardian regarding: the        historical points, exam findings, and any diagnostic results        supporting the discharge diagnosis, need for followup, to        return to the emergency department if symptoms worsen or        persist or if there are any questions or concerns that arise at        home.  Jeremiah Porter  07/31  17:14 Order name: CBC/Diff (With Plt); Complete Time: 19:22           jms  07/31  17:14 Order  name: Blood Urea Nitrogen (Bun); Complete Time: 19:22     jms  07/31  17:14 Order name: CR (Creatinine); Complete Time: 19:22               jms  07/31  17:14 Order name: Electrolytes (Na, K, Cl, Co2); Complete Time: 19:22 jms  07/31  17:14 Order name: GLU (Glucose); Complete Time: 19:22                 jms  07/31  .  Name:Porter, Jeremiah  [id number]  [id number]  Page 2 of 4  %%PAGE  .  Name: Kaio, Jeremiah Porter  MRN: [id number]  Age: 30 yrs  Sex: Male  DOB: 1990/12/06  Arrival Date: 01/26/2020  Arrival Time: 13:53  Account#: [id number]  .  Working Diagnosis: Vomiting  PCP: Forest Becker  .  17:14 Order name: PT (Prothrombin Time With INR); Complete Time: 19:22jms  07/31  17:14 Order name: PTT; Complete Time: 19:22                           jms  07/31  17:14 Order name: ALT/SPGT (Alanine Aminotransferase); Complete Time: jms        19:22  07/31  17:14 Order name: AST/SGOT (Aspartate Aminotranferase); Complete      jms        Time: 19:23  07/31  17:14 Order name: Alkaline Phosphatase (Alk); Complete Time: 19:23    jms  07/31  17:14 Order name: Bilirubin, Direct; Complete Time: 19:23             jms  07/31  17:14 Order  name: Bilirubin, Total; Complete Time: 19:23              jms  07/31  17:14 Order name: Lipase; Complete Time: 19:23                        jms  07/31  17:14 Order name: Tox Screen (Serum)                                  jms  07/31  17:14 Order name: Tox Screen (Urine)                                  jms  07/31  18:36 Order name: GFR, NAA; Complete Time: 19:23                      dispa  t  07/31  18:36 Order name: GFR, AA; Complete Time: 19:23                       dispa  t  07/31  18:49 Order name: Blood Bank Hold; Complete Time: 19:23               dispa  t  .  Dispensed Medications:  17:54 Drug: NS - Sodium Chloride 0.9% IV ml 1000 mL Route: IV; Rate:  kb30        Bolus;  19:35 Follow up: IV Status: Completed infusion; IV Intake:     kq4  18:47 Drug: LR - Lactated Ringers Solution 1000 mL  Route: IV;         em18  19:34 Follow up: IV Status: Completed infusion; IV Intake:     kq4  .  Jeremiah Porter  Attending Notes:  19:37 ED Course: The patient felt much better after IV hydration. He  jms        had no vomiting in the ED. My Working Impression: Vomiting,        dehydration. Attending chart complete and electronically        signed: J.M.Jeannett Senior MD [phone].  Homero Fellers  [id number]  [id number]  Page 3 of 4  %%PAGE  .  Name: Klay, Sobotka  MRN: [id number]  Age: 12 yrs  Sex: Male  DOB: 06/16/1991  Arrival Date: 01/26/2020  Arrival Time: 13:53  Account#: [id number]  .  Working Diagnosis: Vomiting  PCP: Forest Becker  .  Disposition Summary:  01/26/20 19:31  Discharge Ordered        Location: Home -                                                jms        Problem: an acute exacerbation                                  jms        Symptoms: have improved  jms        Condition: Stable                                               jms        Diagnosis          - Vomiting                                                    jms        Followup:                                                       jms          - With: Private Physician          - When: As needed          - Reason:        Discharge Instructions:          - Discharge Summary Sheet                                     jms          - VOMITING (6y-Adult)                                         jms        Forms:          - Medication Reconciliation Form                              jms          - Fax Summary                                                 jms  Signatures:  Barbette Merino                          MD   jms  Dispatcher, [employer]                          dispa  Bourassa, Katherine                          kb30  April Manson                       RN   kh21  Criselda Peaches  sk29  Christiana Fuchs                          BSN  em18  Sheral Flow                         RN   [phone]  .  Corrections: (The following items were deleted from the chart)  19:34 19:25 A sed rate and CRP due to medical the patient reports     jms        nausea vomiting for about 24 hours. Is a history of recurrent        similar problems. He sees a GI doctor in the problems seem to        defy diagnosis. . jms  .  Document is preliminary until electronically or manually signed by the atte  nding physician  .  .  .  .  .  .  .  Hatim, Homann  [id number]  [id number]  Page 4 of 4  .  %%END

## 2020-01-26 NOTE — ED Provider Notes (Signed)
 .  .  Name: Logun, Colavito  MRN: [id number]  Age: 29 yrs  Sex: Male  DOB: 1990/10/21  Arrival Date: 01/26/2020  Arrival Time: 13:53  Account#: [id number]  Bed HA2  PCP: Forest Becker  Chief Complaint: Vomiting  .  Presentation:  07/31  14:14 Presenting complaint: Patient states: pt reports vomiting       kh21        starting today . "i have a bad GI track" reports abd pain . had        3 glasses of whiskey last night.  14:14 Method Of Arrival: Walk In                                      kh21  14:14 Acuity: Adult 3                                                 kh21  .  Historical:  - Allergies:  14:16 No known drug Allergies;                                        kh21  14:16 lactose;                                                        kh21  - Home Meds:  14:16 effexor, mirtazepine, sulcrafate [Active];                      kh21  - PMHx:  14:16 depression, stomach problems;                                   kh21  .  - Social history: Patient uses alcohol occasionally. Marijuana    Patient/guardian denies using IV drugs.  - The history from nurses notes was reviewed: and I agree with    what is documented.  .  .  Screening:  14:16 SEPSIS SCREENING - Temp > 38.3 or < 36.0 No - Heart Rate > 90   kh21        Yes - Respiratory > 20 No - SBP < 90 No SIRS Criteria (> = 2)        No. Safety screen: Patient feels safe. Suicide (ED Safe)        Screening: In the past two weeks have you felt down, depressed        or hopelessquestion No. Suicidal Thoughts: Over the past two weeks        patient DENIES thoughts of killing self. Denies prior suicide        attempts. Fall Risk None identified. Exposure Risk/Travel        Screening: COVID Symptomsquestion None. Known COVID 19 exposurequestion No.        DPH requests Isolationquestion(COVID) No. Have you tested + for COVIDquestion        No. COVID 19 Vaccinequestion Yes-patient states they completed  COVID        vaccine recommendations over 2 weeks ago.  .  Vital Signs:  14:09  Pulse 97 Monitor; Resp 20 Spontaneous; Temp 36.1(O); Pulse Ox   sl36        100% on R/A;  14:09                                                                 sl36  16:35 BP 133 / 34; Pulse 96; Resp 18 Spontaneous; Pulse Ox 100% ;     cc38  18:34 BP 133 / 83 Left Arm Sitting (auto/reg); Pulse 87 Monitor; Resp pd6  .  Name:Summerall, Lundy  [id number]  [id number]  Page 1 of 3  %%PAGE  .  Name: Olajuwon, Fosdick  MRN: [id number]  Age: 65 yrs  Sex: Male  DOB: 11-28-1990  Arrival Date: 01/26/2020  Arrival Time: 13:53  Account#: [id number]  Bed HA2  PCP: Forest Becker  Chief Complaint: Vomiting  .        16 Spontaneous; Temp 37.1(O); Pulse Ox 97% on R/A;  19:32 BP 126 / 80; Pulse 78; Resp 16; Temp 36.8; Pulse Ox 100% on     kq4        R/A; Pain 0/10;  14:09 unable to obtain BP in triage                                   sl36  .  Glasgow Coma Score:  14:16 Eye Response: spontaneous(4). Verbal Response: oriented(5).     kh21        Motor Response: obeys commands(6). Total: 15.  .  Triage Assessment:  14:16 General: Appears in no apparent distress, comfortable, Behavior kh21        is appropriate for age. Pain: Complains of pain in abd pain.  14:16 GI: Reports lower abdominal pain, vomiting.                     kh21  .  Assessment:  19:27 General: Behavior is cooperative. General: Denies fever,        em18        chills. Pain: Unable to use pain scale. Pain: Denies pain.        Neuro: Eye opening: Spontaneously Level on consciousness:        Sustained Attention Verbal Response: Orientation: Oriented x 3        Motor Response: Obeys Commands. Cardiovascular: Capillary        refill < 3 seconds. Respiratory: Airway is patent Respiratory        effort is unlabored, Respiratory pattern is symmetrical. GI:        Abdomen is non- distended Abd is soft and non tender Reports        intolerance of fluids, intolerance of food, nausea. GU: Reports        little urine output today. Skin: Skin is intact, is healthy        with good  turgor. Musculoskeletal: Range of motion intact in        all extremities.  .  Observations:  13:53 Patient arrived in ED.  mm80  14:04 Patient Visited By: Criselda Peaches                             sk29  14:04 Registration completed.                                         sk29  14:16 Triage Completed.                                               [id number]  17:13 Patient Visited By: Helyn Numbers  .  Procedure:  17:54 Labs drawn. Sent per order to lab. Inserted peripheral IV: 18   kb30        gauge in left antecubital area.  19:34 Discontinued intact, bleeding controlled, pressure dressing     kq4        applied, No redness/swelling at site.  .  Dispensed Medications:  .  Name:Skop, Keshon  EHM:[id number]  [id number]  Page 2 of 3  %%PAGE  .  Name: Avir, Deruiter  MRN: [id number]  Age: 26 yrs  Sex: Male  DOB: [birth year]  Arrival Date: 01/26/2020  Arrival Time: 13:53  Account#: [id number]  Bed HA2  PCP: Forest Becker  Chief Complaint: Vomiting  .  17:54 Drug: NS - Sodium Chloride 0.9% IV ml 1000 mL Route: IV; Rate:  kb30        Bolus;  19:35 Follow up: IV Status: Completed infusion; IV Intake:     kq4  18:47 Drug: LR - Lactated Ringers Solution 1000 mL Route: IV;         em18  19:34 Follow up: IV Status: Completed infusion; IV Intake:     kq4  .  Marland Kitchen  Intake:  19:34 IV: 1000.36ml; Total: 1000.103ml.                                kq4  19:35 IV: 1000.1ml; Total: 2000.107ml.                                kq4  .  Interventions:  13:53 Driver's License Scanned into Chart                             mm80  17:13 Demo Sheet Scanned into Chart                                   ew  19:27 Armband on Placed in gown Call light in reach Bed in low        em18        position Side Rail up X 2.  .  Outcome:  19:31 Discharge ordered by MD.  jms  19:32 Discharged to home ambulatory. Condition: good.                  kq4  19:50 Patient left the ED.                                            kq4  .  Corrections: (The following items were deleted from the chart)  14:17 14:14 Presenting complaint: Patient states: pt reports vomiting kh21        starting today . "i have a bad GI track" reports abd pain kh21  .  Signatures:  Barbette Merino                          MD   Roxan Hockey, Euni                                   ew  Sheral Flow                        RN   [address] [place]                          kb30  April Manson                       RN   kh21  [place], Shonice                              sk29  Christiana Fuchs                          BSN  em18  Dimaano, Bassett                        CCT  pd6  [place], [place]                               CCT  sl36  Carma Leaven                      CCT  cc38  Si Gaul                        Reg  mm80  .  .  .  .  Braedan, Meuth  [id number]  [id number]  Page 3 of 3  .  %%END
